# Patient Record
Sex: Female | Born: 1970 | Race: White | Hispanic: No | Marital: Married | Smoking: Never smoker
Health system: Southern US, Community
[De-identification: ages and names within clinical notes are randomized; demographics above are authoritative.]

## PROBLEM LIST (undated history)

## (undated) DIAGNOSIS — S134XXA Sprain of ligaments of cervical spine, initial encounter: Secondary | ICD-10-CM

## (undated) DIAGNOSIS — I959 Hypotension, unspecified: Secondary | ICD-10-CM

## (undated) DIAGNOSIS — G43909 Migraine, unspecified, not intractable, without status migrainosus: Secondary | ICD-10-CM

## (undated) DIAGNOSIS — H9319 Tinnitus, unspecified ear: Secondary | ICD-10-CM

## (undated) DIAGNOSIS — N83209 Unspecified ovarian cyst, unspecified side: Secondary | ICD-10-CM

## (undated) DIAGNOSIS — R200 Anesthesia of skin: Secondary | ICD-10-CM

## (undated) DIAGNOSIS — K219 Gastro-esophageal reflux disease without esophagitis: Secondary | ICD-10-CM

## (undated) DIAGNOSIS — S060XAA Concussion with loss of consciousness status unknown, initial encounter: Secondary | ICD-10-CM

## (undated) DIAGNOSIS — G9050 Complex regional pain syndrome I, unspecified: Secondary | ICD-10-CM

## (undated) DIAGNOSIS — S060X9A Concussion with loss of consciousness of unspecified duration, initial encounter: Secondary | ICD-10-CM

## (undated) DIAGNOSIS — J45909 Unspecified asthma, uncomplicated: Secondary | ICD-10-CM

## (undated) DIAGNOSIS — Q048 Other specified congenital malformations of brain: Secondary | ICD-10-CM

## (undated) DIAGNOSIS — T7840XA Allergy, unspecified, initial encounter: Secondary | ICD-10-CM

## (undated) HISTORY — PX: OTHER SURGICAL HISTORY: SHX169

## (undated) HISTORY — PX: HAND SURGERY: SHX662

## (undated) HISTORY — DX: Allergy, unspecified, initial encounter: T78.40XA

## (undated) HISTORY — DX: Gastro-esophageal reflux disease without esophagitis: K21.9

---

## 1999-11-12 HISTORY — PX: HAND SURGERY: SHX662

## 2001-06-09 ENCOUNTER — Ambulatory Visit (HOSPITAL_BASED_OUTPATIENT_CLINIC_OR_DEPARTMENT_OTHER): Admission: RE | Admit: 2001-06-09 | Discharge: 2001-06-09 | Payer: Self-pay | Admitting: Orthopedic Surgery

## 2002-03-31 ENCOUNTER — Encounter: Admission: RE | Admit: 2002-03-31 | Discharge: 2002-03-31 | Payer: Self-pay | Admitting: Family Medicine

## 2002-03-31 ENCOUNTER — Encounter: Payer: Self-pay | Admitting: Family Medicine

## 2003-07-15 ENCOUNTER — Encounter: Payer: Self-pay | Admitting: Family Medicine

## 2003-07-15 ENCOUNTER — Encounter: Admission: RE | Admit: 2003-07-15 | Discharge: 2003-07-15 | Payer: Self-pay | Admitting: Family Medicine

## 2003-07-27 ENCOUNTER — Encounter: Payer: Self-pay | Admitting: Family Medicine

## 2003-07-27 ENCOUNTER — Encounter: Admission: RE | Admit: 2003-07-27 | Discharge: 2003-07-27 | Payer: Self-pay | Admitting: Family Medicine

## 2003-12-19 ENCOUNTER — Other Ambulatory Visit: Admission: RE | Admit: 2003-12-19 | Discharge: 2003-12-19 | Payer: Self-pay | Admitting: Obstetrics & Gynecology

## 2004-02-06 ENCOUNTER — Encounter: Admission: RE | Admit: 2004-02-06 | Discharge: 2004-02-06 | Payer: Self-pay | Admitting: Obstetrics & Gynecology

## 2004-11-23 ENCOUNTER — Ambulatory Visit: Payer: Self-pay | Admitting: Family Medicine

## 2005-06-05 ENCOUNTER — Ambulatory Visit: Payer: Self-pay | Admitting: Family Medicine

## 2005-06-07 ENCOUNTER — Other Ambulatory Visit: Admission: RE | Admit: 2005-06-07 | Discharge: 2005-06-07 | Payer: Self-pay | Admitting: Obstetrics & Gynecology

## 2005-06-18 ENCOUNTER — Encounter: Admission: RE | Admit: 2005-06-18 | Discharge: 2005-06-18 | Payer: Self-pay | Admitting: Family Medicine

## 2005-06-18 ENCOUNTER — Ambulatory Visit: Payer: Self-pay | Admitting: Family Medicine

## 2005-06-20 ENCOUNTER — Encounter: Admission: RE | Admit: 2005-06-20 | Discharge: 2005-07-10 | Payer: Self-pay | Admitting: Family Medicine

## 2005-09-24 ENCOUNTER — Ambulatory Visit: Payer: Self-pay | Admitting: Family Medicine

## 2006-01-10 ENCOUNTER — Ambulatory Visit: Payer: Self-pay | Admitting: Family Medicine

## 2006-05-06 ENCOUNTER — Ambulatory Visit: Payer: Self-pay | Admitting: Family Medicine

## 2006-05-19 ENCOUNTER — Ambulatory Visit: Payer: Self-pay | Admitting: Family Medicine

## 2006-05-21 ENCOUNTER — Ambulatory Visit: Payer: Self-pay | Admitting: Family Medicine

## 2006-12-09 ENCOUNTER — Ambulatory Visit: Payer: Self-pay | Admitting: Family Medicine

## 2006-12-15 ENCOUNTER — Ambulatory Visit: Payer: Self-pay | Admitting: Family Medicine

## 2007-07-17 ENCOUNTER — Ambulatory Visit: Payer: Self-pay | Admitting: Family Medicine

## 2007-07-17 DIAGNOSIS — H9319 Tinnitus, unspecified ear: Secondary | ICD-10-CM | POA: Insufficient documentation

## 2007-07-17 DIAGNOSIS — J309 Allergic rhinitis, unspecified: Secondary | ICD-10-CM

## 2007-07-17 DIAGNOSIS — J3089 Other allergic rhinitis: Secondary | ICD-10-CM | POA: Insufficient documentation

## 2007-07-17 DIAGNOSIS — N83209 Unspecified ovarian cyst, unspecified side: Secondary | ICD-10-CM

## 2007-07-24 ENCOUNTER — Encounter: Admission: RE | Admit: 2007-07-24 | Discharge: 2007-07-24 | Payer: Self-pay | Admitting: Family Medicine

## 2007-11-13 ENCOUNTER — Encounter: Admission: RE | Admit: 2007-11-13 | Discharge: 2007-11-13 | Payer: Self-pay | Admitting: Obstetrics and Gynecology

## 2008-01-08 ENCOUNTER — Telehealth: Payer: Self-pay | Admitting: Family Medicine

## 2008-03-10 ENCOUNTER — Ambulatory Visit: Payer: Self-pay | Admitting: Family Medicine

## 2008-03-14 ENCOUNTER — Telehealth: Payer: Self-pay | Admitting: Family Medicine

## 2008-03-14 ENCOUNTER — Ambulatory Visit: Payer: Self-pay | Admitting: Family Medicine

## 2009-04-24 ENCOUNTER — Ambulatory Visit: Payer: Self-pay | Admitting: Family Medicine

## 2009-04-24 DIAGNOSIS — K219 Gastro-esophageal reflux disease without esophagitis: Secondary | ICD-10-CM | POA: Insufficient documentation

## 2009-12-13 ENCOUNTER — Ambulatory Visit: Payer: Self-pay | Admitting: Family Medicine

## 2009-12-13 DIAGNOSIS — J1189 Influenza due to unidentified influenza virus with other manifestations: Secondary | ICD-10-CM

## 2009-12-15 ENCOUNTER — Telehealth: Payer: Self-pay | Admitting: Family Medicine

## 2009-12-19 ENCOUNTER — Telehealth: Payer: Self-pay | Admitting: Family Medicine

## 2010-03-09 ENCOUNTER — Ambulatory Visit: Payer: Self-pay | Admitting: Family Medicine

## 2010-12-11 NOTE — Assessment & Plan Note (Signed)
Summary: allergy/cjr   Vital Signs:  Patient profile:   40 year old female Weight:      123 pounds Temp:     98.3 degrees F oral BP sitting:   106 / 78  (left arm) Cuff size:   regular  Vitals Entered By: Raechel Ache, RN (March 09, 2010 11:51 AM) CC: C/o allergies- sneezing and drainage.   History of Present Illness: Here for a flare of her allergies. Has itchy eyes, sneezing, PND, and a dry cough. No fever. On Fexofenidine daily.   Allergies: 1)  ! Augmentin  Past History:  Past Medical History: Reviewed history from 04/24/2009 and no changes required. Allergic rhinitis GERD  Review of Systems  The patient denies anorexia, fever, weight loss, weight gain, vision loss, decreased hearing, hoarseness, chest pain, syncope, dyspnea on exertion, peripheral edema, headaches, hemoptysis, abdominal pain, melena, hematochezia, severe indigestion/heartburn, hematuria, incontinence, genital sores, muscle weakness, suspicious skin lesions, transient blindness, difficulty walking, depression, unusual weight change, abnormal bleeding, enlarged lymph nodes, angioedema, breast masses, and testicular masses.    Physical Exam  General:  Well-developed,well-nourished,in no acute distress; alert,appropriate and cooperative throughout examination Head:  Normocephalic and atraumatic without obvious abnormalities. No apparent alopecia or balding. Eyes:  No corneal or conjunctival inflammation noted. EOMI. Perrla. Funduscopic exam benign, without hemorrhages, exudates or papilledema. Vision grossly normal. Ears:  External ear exam shows no significant lesions or deformities.  Otoscopic examination reveals clear canals, tympanic membranes are intact bilaterally without bulging, retraction, inflammation or discharge. Hearing is grossly normal bilaterally. Nose:  External nasal examination shows no deformity or inflammation. Nasal mucosa are pink and moist without lesions or exudates. Mouth:  Oral  mucosa and oropharynx without lesions or exudates.  Teeth in good repair. Neck:  No deformities, masses, or tenderness noted. Lungs:  Normal respiratory effort, chest expands symmetrically. Lungs are clear to auscultation, no crackles or wheezes.   Impression & Recommendations:  Problem # 1:  ALLERGIC RHINITIS (ICD-477.9)  Her updated medication list for this problem includes:    Fexofenadine Hcl 60 Mg Tabs (Fexofenadine hcl) .Marland Kitchen... 1 d as needed  Orders: Depo- Medrol 80mg  (J1040) Admin of Therapeutic Inj  intramuscular or subcutaneous ([address])  Complete Medication List: 1)  Mucinex Dm Maximum Strength 60-1200 Mg Xr12h-tab (Dextromethorphan-guaifenesin) .... Use as directed 2)  Fexofenadine Hcl 60 Mg Tabs (Fexofenadine hcl) .Marland Kitchen.. 1 d as needed  Patient Instructions: 1)  Given a steroid shot. Continue antihistamines.  2)  Please schedule a follow-up appointment as needed .    Medication Administration  Injection # 1:    Medication: Depo- Medrol 80mg     Diagnosis: ALLERGIC RHINITIS (ICD-477.9)    Route: IM    Site: RUOQ gluteus    Exp Date: 11/13    Lot #: [id number]    Mfr: Pharmacia    Comments: 120 mg given    Patient tolerated injection without complications    Given by: Raechel Ache, RN (March 09, 2010 1:30 PM)  Orders Added: 1)  Est. Patient Level IV [09811] 2)  Depo- Medrol 80mg  [J1040] 3)  Admin of Therapeutic Inj  intramuscular or subcutaneous [91478]

## 2010-12-11 NOTE — Progress Notes (Signed)
Summary: RX  Phone Note Call from Patient   Caller: Patient Call For: Nelwyn Salisbury MD Summary of Call: Pt saw Dr. Scotty Court Wednesday with flu and given Tamiflu. She needs RX for cough, congestion, and sinus pain, please.  Did not have these symptoms when here. [phone] Initial call taken by: Lynann Beaver CMA,  December 15, 2009 9:22 AM  Follow-up for Phone Call        use Mucinex D for the sinus pressure, and call in Hydromet syrup, 1 tsp q 4 hours as needed cough, 240 ml, no rf Follow-up by: Nelwyn Salisbury MD,  December 15, 2009 9:37 AM  Additional Follow-up for Phone Call Additional follow up Details #1::        [facility]) and pt notified.  Cannot put in med list until chart signed off. Additional Follow-up by: Lynann Beaver CMA,  December 15, 2009 11:44 AM

## 2010-12-11 NOTE — Assessment & Plan Note (Signed)
Summary: CHILLS,BODY ACHES & SORENESS/WANTS CHECK & MED TO TREAT FLU/P...   Vital Signs:  Patient profile:   40 year old female Weight:      123 pounds Temp:     98.4 degrees F oral Pulse rate:   90 / minute BP sitting:   96 / 62  Vitals Entered By: Lynann Beaver CMA (December 13, 2009 1:58 PM) CC: chills and body aches x 2 days Is Patient Diabetic? No Pain Assessment Patient in pain? no        History of Present Illness: This 40 year old white femalehas had chills fever headache and over the past 2 days increasing in severity last night, cough slightly productive of mucus some sore throat there no gastrointestinal symptoms, no GU symptoms, joints are aching No other complaints  Allergies: 1)  ! 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    Physical Exam  General:  well-developed well-nourished but appears acutely ill Head:  Normocephalic and atraumatic without obvious abnormalities. No apparent alopecia or balding. Eyes:  No corneal or conjunctival inflammation noted. EOMI. Perrla. Funduscopic exam benign, without hemorrhages, exudates or papilledema. Vision grossly normal. Ears:  External ear exam shows no significant lesions or deformities.  Otoscopic examination reveals clear canals,  tympanic membranes are intact bilaterally without bulging, retraction, inflammation or discharge. Hearing is grossly normal bilaterally. Nose:  minimal congestion Mouth:  minimal erythema of pharynx Neck:  No deformities, masses, or tenderness noted. Lungs:  Normal respiratory effort, chest expands symmetrically. Lungs are clear to auscultation, no crackles or wheezes. Heart:  Normal rate and regular rhythm. S1 and S2 normal without gallop, murmur, click, rub or other extra sounds.   Impression & Recommendations:  Problem # 1:  INFLUENZA (ICD-487.8) Assessment New  tamiflu 75 mg two times a day ibuprofen 800 mg q 4-6 h prn  Orders: Prescription Created Electronically [phone])  Problem # 2:  ALLERGIC RHINITIS (ICD-477.9) Assessment: Improved  The following medications were removed from the medication list:    Allegra 180 Mg Tabs (Fexofenadine hcl) ..... Once daily  Complete Medication List: 1)  Tamiflu 75 Mg Caps (Oseltamivir phosphate) .Marland Kitchen.. 1 two times a day to shorten or prevent flu 2)  Ibuprofen 200 Mg Caps (Ibuprofen) .... 3-4 every 6 hours p.r.n. a container for  Patient Instructions: 1)  You nhave influenza 2)  tamiflu two times a day x 5 days 3)  omnaris nasal spray 2 sprays in each nostril daily 4)  Keep good fluid intake 5)  ibuprofen 800 mg q4-6h as needed aching pain or fever 6)  call if any change Prescriptions: TAMIFLU 75 MG CAPS (OSELTAMIVIR PHOSPHATE) 1 two times a day to shorten or prevent flu  #10 x 1   Entered and Authorized by:   Judithann Sheen MD   Signed by:   Judithann Sheen MD on 12/13/2009   Method used:   [facility]ically to        [id number]  [address]       [place], [place]  [address]       Ph: [phone] or [phone]       Fax: [phone]   RxID:   [id number]

## 2010-12-11 NOTE — Progress Notes (Signed)
Summary: requesting antibiotic  Phone Note Call from Patient   Caller: Patient Call For: Rickard Patience, MD Summary of Call: Pt has finished the Tamiflu, and is still having a lot of nasal congestion with slightly colored mucus.  Is asking for an antibiotic.  No fever or productive cough. [facility]) [phone] Initial call taken by: Lynann Beaver CMA,  December 19, 2009 9:01 AM  Follow-up for Phone Call        per dr Scotty Court  cephalaxin 500mg  three times a day x 10 days ERROR_wrong pt--pharmacy called to disregard this.  per dr Scotty Court z pak and mucinex D max strength OTC.  Patient notified. see Rx Follow-up by: Gladis Riffle, RN,  December 19, 2009 5:17 PM    New/Updated Medications: CEPHALEXIN 500 MG TABS (CEPHALEXIN) Take 1 tablet by mouth three times a day x 10 days ZITHROMAX Z-PAK 250 MG TABS (AZITHROMYCIN) Take two at once and then Take 1 tablet by mouth once a day MUCINEX DM MAXIMUM STRENGTH 60-1200 MG XR12H-TAB (DEXTROMETHORPHAN-GUAIFENESIN) use as directed Prescriptions: ZITHROMAX Z-PAK 250 MG TABS (AZITHROMYCIN) Take two at once and then Take 1 tablet by mouth once a day  #1 pkg x 0   Entered by:   Gladis Riffle, RN   Authorized by:   Judithann Sheen MD   Signed by:   Gladis Riffle, RN on 12/19/2009   Method used:   Electronically to        [id number]       [address]       [place], [place]  [address]       Ph: [phone] or [phone]       Fax: [phone]   RxID:   [id number] CEPHALEXIN 500 MG TABS (CEPHALEXIN) Take 1 tablet by mouth three times a day x 10 days  #30 x 0   Entered by:   Gladis Riffle, RN   Authorized by:   Judithann Sheen MD   Signed by:   Gladis Riffle, RN on 12/19/2009   Method used:   Electronically to        [id number]       [address]       [place], [place]  [address]       Ph: [phone] or [phone]       Fax: [phone]   RxID:   [id number]  disregard cephalexin as this was done in  error--called pharmacy to notify them

## 2011-02-05 ENCOUNTER — Encounter: Payer: Self-pay | Admitting: Family Medicine

## 2011-02-07 ENCOUNTER — Ambulatory Visit (INDEPENDENT_AMBULATORY_CARE_PROVIDER_SITE_OTHER): Payer: BC Managed Care – PPO | Admitting: Family Medicine

## 2011-02-07 ENCOUNTER — Encounter: Payer: Self-pay | Admitting: Family Medicine

## 2011-02-07 VITALS — BP 110/70 | HR 78 | Temp 98.5°F | Ht 63.5 in | Wt 128.0 lb

## 2011-02-07 DIAGNOSIS — Z9109 Other allergy status, other than to drugs and biological substances: Secondary | ICD-10-CM

## 2011-02-07 DIAGNOSIS — J309 Allergic rhinitis, unspecified: Secondary | ICD-10-CM

## 2011-02-07 MED ORDER — METHYLPREDNISOLONE ACETATE 80 MG/ML IJ SUSP
120.0000 mg | Freq: Once | INTRAMUSCULAR | Status: AC
  Administered 2011-02-07: 120 mg via INTRAMUSCULAR

## 2011-02-07 NOTE — Progress Notes (Signed)
  Subjective:    Patient ID: Susan Foster, female    DOB: 10/25/1971, 40 y.o.   MRN: [id number]  HPI Here for typical springtime allergies. She has been struggling with itchy eyes, facial puffiness, PND, swelling in the back of the throat, and a dry cough for about one week. No ST or fever. Using Fexofenidine.    Review of Systems  Constitutional: Negative.   HENT: Positive for facial swelling, rhinorrhea and postnasal drip. Negative for ear pain, congestion and sinus pressure.   Eyes: Negative for discharge and redness.  Respiratory: Negative.   Cardiovascular: Negative.        Objective:   Physical Exam  Constitutional: She appears well-developed and well-nourished.  HENT:  Head: Normocephalic and atraumatic.  Right Ear: External ear normal.  Left Ear: External ear normal.  Nose: Nose normal.  Mouth/Throat: Oropharynx is clear and moist. No oropharyngeal exudate.  Eyes: Conjunctivae are normal. Pupils are equal, round, and reactive to light. Right eye exhibits no discharge. Left eye exhibits no discharge.  Neck: Normal range of motion. Neck supple.  Pulmonary/Chest: Effort normal and breath sounds normal.  Lymphadenopathy:    She has no cervical adenopathy.          Assessment & Plan:  This is a flare of her allergies. Given a steroid shot.

## 2011-03-29 NOTE — Op Note (Signed)
[place]  Patient:    Susan Foster, Susan Foster                        MRN: [id number] Proc. Date: 06/09/01 Adm. Date:  [id number] Attending:  Susa Day                           Operative Report  PREOPERATIVE DIAGNOSIS:  Displaced transverse unstable fracture, right small finger metacarpal diaphysis.  POSTOPERATIVE DIAGNOSIS:  Displaced transverse unstable fracture, right small finger metacarpal diaphysis.  PROCEDURE:  Open reduction and internal fixation of right small finger metacarpal unstable diaphyseal fracture.  SURGEON:  Katy Fitch. Sypher, Montez Hageman., M.D.  ASSISTANT:  Jonni Sanger, P.A.  ANESTHESIA:  General by LMA supervised by the anesthesiologist, Janetta Hora. Gelene Mink, M.D.  INDICATIONS:  Susan Foster is a 40 year old woman who accidentally struck her right hand against a firm object (a tree) on June 07, 2001.  She had acute pain, swelling, and deformity of the hand.  She was seen by her primary care physician, Dr. Clent Ridges, at [facility], and had x-rays obtained, which revealed a displaced diaphyseal fracture of the right small finger metacarpal. This was clearly unstable and angulated apex dorsally.  She was sent for hand surgery consult.  Clinical examination confirmed her fracture predicament and the instability of her fracture.  I recommended proceeding with open reduction and internal fixation with an ASIF four-hole plate, which I placed in a dorsal ulnar position.  After informed consent, she was brought to the operating room at this time.  DESCRIPTION OF PROCEDURE:  Susan Foster is brought to the operating room and placed in the supine position on the operating table.  Following induction of general anesthesia by LMA, the right arm is prepped with Betadine soap and solution and sterilely draped.  Following exsanguination of the limb with Esmarch bandage, the arterial tourniquet was inflated to 220 mmHg.   The procedure commenced with a longitudinal incision over the dorsal ulnar aspect of the small finger metacarpal.  Subcutaneous tissues were carefully divided, splitting the interval between the extensor digitorum longus and the extensor digiti minimi tendon.  The periosteum overlying the fracture site was ruptured, and a hematoma had collected.  The periosteum was incised and elevated with a Therapist, nutritional, exposing the fracture site.  There was mild comminution.  The fracture was grossly unstable due to disruption of the periosteum circumferentially.  This was anatomically reduced, and a four-hole ASIF plate was placed in dorsal ulnar position and secured with three 8 mm screws in the three distal holes and a 10 mm screw in the most proximal hole.  Anatomic reduction was achieved. AP, lateral C-arm images were obtained, documenting satisfactory reduction of the fracture and good position of the hardware.  The wound was irrigated and subsequently repaired with intradermal 3-0 Prolene.  A compressive dressing was applied with the hand in safe position.  No apparent complications. DD:  06/09/01 TD:  06/09/01 Job: [id number]

## 2011-03-29 NOTE — Assessment & Plan Note (Signed)
[facility]                                 ON-CALL NOTE   NAME:INGE, ALAINE LOUGHNEY                      MRN:          [id number]  DATE:12/13/2006                            DOB:          1971/08/04    OUTPATIENT PHONE CONSULTATION: December 13, 2006.  PHONE NUMBER: [phone].   OBJECTIVE:  The patient thinks she may be having an allergic reaction to  medication. She saw Dr. Clent Ridges on Tuesday for an infection under the nail  bed which is on the lateral side of the nail. She was put on Augmentin  which she has been on since that time. This morning she developed a rash  on her chest, abdomen, waist and back which has become progressively  itchy since that time. She had night sweats and chills when she was put  on the antibiotic. Those have resolved. She describes it as on the  lateral side of the nailbed, it has significantly improved where it is  not inflamed.   ASSESSMENT:  Possible allergy to Augmentin.   PLAN:  Stop antibiotics. Do aggressive soaking of the nail in hot water  every 2 hours for the next 2-3 days. Call back if it worsens or go in to  see Dr. Clent Ridges again. Otherwise continue with soaks.   PRIMARY CARE PHYSICIAN:  Dr. Clent Ridges.     Arta Silence, MD  Electronically Signed    RNS/MedQ  DD: 12/13/2006  DT: 12/13/2006  Job #: [id number]

## 2011-08-05 ENCOUNTER — Encounter: Payer: Self-pay | Admitting: Family Medicine

## 2011-08-05 ENCOUNTER — Ambulatory Visit (INDEPENDENT_AMBULATORY_CARE_PROVIDER_SITE_OTHER): Payer: BC Managed Care – PPO | Admitting: Family Medicine

## 2011-08-05 VITALS — BP 110/64 | HR 87 | Temp 98.3°F | Wt 127.0 lb

## 2011-08-05 DIAGNOSIS — J329 Chronic sinusitis, unspecified: Secondary | ICD-10-CM

## 2011-08-05 MED ORDER — AZITHROMYCIN 250 MG PO TABS: ORAL_TABLET | ORAL | Status: AC

## 2011-08-05 NOTE — Progress Notes (Signed)
  Subjective:    Patient ID: Susan Foster, female    DOB: June 15, 1971, 40 y.o.   MRN: [id number]  HPI Here for 5 days of stuffy head, HA, PND, ST, and a dry cough. No fever. On Fexofenadine.    Review of Systems  Constitutional: Negative.   HENT: Positive for congestion, postnasal drip and sinus pressure.   Eyes: Negative.   Respiratory: Positive for cough.        Objective:   Physical Exam  Constitutional: She appears well-developed and well-nourished.  HENT:  Right Ear: External ear normal.  Left Ear: External ear normal.  Nose: Nose normal.  Mouth/Throat: Oropharynx is clear and moist. No oropharyngeal exudate.  Eyes: Conjunctivae are normal. Pupils are equal, round, and reactive to light.  Neck: Neck supple. No thyromegaly present.  Pulmonary/Chest: Effort normal and breath sounds normal.  Lymphadenopathy:    She has no cervical adenopathy.          Assessment & Plan:  Add Mucinex D and a Zpack

## 2011-10-08 ENCOUNTER — Other Ambulatory Visit: Payer: Self-pay | Admitting: Family Medicine

## 2011-10-08 MED ORDER — FEXOFENADINE HCL 60 MG PO TABS: 60.0000 mg | ORAL_TABLET | Freq: Every day | ORAL | Status: DC

## 2011-10-08 NOTE — Telephone Encounter (Signed)
done

## 2011-10-08 NOTE — Telephone Encounter (Signed)
Pt req refill of fexofenadine (ALLEGRA) [facility] MG [facility] [address]

## 2012-02-19 ENCOUNTER — Ambulatory Visit (INDEPENDENT_AMBULATORY_CARE_PROVIDER_SITE_OTHER): Payer: BC Managed Care – PPO | Admitting: Family Medicine

## 2012-02-19 ENCOUNTER — Encounter: Payer: Self-pay | Admitting: Family Medicine

## 2012-02-19 VITALS — BP 112/64 | HR 78 | Temp 98.5°F | Wt 127.0 lb

## 2012-02-19 DIAGNOSIS — J309 Allergic rhinitis, unspecified: Secondary | ICD-10-CM

## 2012-02-19 DIAGNOSIS — Z9109 Other allergy status, other than to drugs and biological substances: Secondary | ICD-10-CM

## 2012-02-19 MED ORDER — METHYLPREDNISOLONE ACETATE 80 MG/ML IJ SUSP
120.0000 mg | Freq: Once | INTRAMUSCULAR | Status: AC
  Administered 2012-02-19: 120 mg via INTRAMUSCULAR

## 2012-02-19 NOTE — Progress Notes (Signed)
  Subjective:    Patient ID: Susan Foster, female    DOB: 08-19-1971, 41 y.o.   MRN: [id number]  HPI Here for her typical springtime flare of allergies. She gets itchy eyes, runny nose, congestion, and sneezing. No cough or wheezing. On Allegra.    Review of Systems  Constitutional: Negative.   HENT: Positive for congestion, rhinorrhea, sneezing and postnasal drip.   Eyes: Positive for redness and itching. Negative for discharge.  Respiratory: Negative.        Objective:   Physical Exam  Constitutional: She appears well-developed and well-nourished.  HENT:  Right Ear: External ear normal.  Left Ear: External ear normal.  Nose: Nose normal.  Mouth/Throat: Oropharynx is clear and moist. No oropharyngeal exudate.  Eyes: Conjunctivae are normal.  Pulmonary/Chest: Effort normal and breath sounds normal.  Lymphadenopathy:    She has no cervical adenopathy.          Assessment & Plan:  Recheck prn

## 2012-02-19 NOTE — Progress Notes (Signed)
Addended by: Aniceto Boss A on: 02/19/2012 10:28 AM   Modules accepted: Orders

## 2012-03-25 ENCOUNTER — Encounter: Payer: Self-pay | Admitting: Family Medicine

## 2012-03-25 ENCOUNTER — Ambulatory Visit (INDEPENDENT_AMBULATORY_CARE_PROVIDER_SITE_OTHER): Payer: BC Managed Care – PPO | Admitting: Family Medicine

## 2012-03-25 VITALS — BP 110/62 | HR 77 | Temp 98.6°F | Wt 124.0 lb

## 2012-03-25 DIAGNOSIS — J309 Allergic rhinitis, unspecified: Secondary | ICD-10-CM

## 2012-03-25 DIAGNOSIS — Z9109 Other allergy status, other than to drugs and biological substances: Secondary | ICD-10-CM

## 2012-03-25 MED ORDER — METHYLPREDNISOLONE ACETATE 80 MG/ML IJ SUSP
120.0000 mg | Freq: Once | INTRAMUSCULAR | Status: AC
  Administered 2012-03-25: 120 mg via INTRAMUSCULAR

## 2012-03-25 NOTE — Progress Notes (Signed)
Addended by: Aniceto Boss A on: 03/25/2012 10:59 AM   Modules accepted: Orders

## 2012-03-25 NOTE — Progress Notes (Signed)
  Subjective:    Patient ID: Susan Foster, female    DOB: [birth year], 41 y.o.   MRN: [id number]  HPI Here for another flare of her allergies. She had a Depomedrol shot here about 5 weeks ago, and this worked quite well. However 4 days ago she again started having sneezing, itchy eyes, runny nose, and PND. No fever or cough. On Allegra.    Review of Systems  Constitutional: Negative.   HENT: Positive for rhinorrhea, sneezing and postnasal drip.   Eyes: Positive for itching. Negative for pain and redness.  Respiratory: Negative.        Objective:   Physical Exam  Constitutional: She appears well-developed and well-nourished.  HENT:  Right Ear: External ear normal.  Left Ear: External ear normal.  Nose: Nose normal.  Mouth/Throat: Oropharynx is clear and moist.  Eyes: Conjunctivae are normal.  Neck: No thyromegaly present.  Pulmonary/Chest: Effort normal and breath sounds normal.  Lymphadenopathy:    She has no cervical adenopathy.          Assessment & Plan:  Given another steroid shot. That should get her through the rest of the allergy season

## 2012-06-02 ENCOUNTER — Encounter: Payer: Self-pay | Admitting: Family Medicine

## 2012-06-02 ENCOUNTER — Ambulatory Visit (INDEPENDENT_AMBULATORY_CARE_PROVIDER_SITE_OTHER): Payer: BC Managed Care – PPO | Admitting: Family Medicine

## 2012-06-02 VITALS — BP 108/64 | HR 83 | Temp 98.7°F | Wt 121.0 lb

## 2012-06-02 DIAGNOSIS — J309 Allergic rhinitis, unspecified: Secondary | ICD-10-CM

## 2012-06-02 DIAGNOSIS — Z9109 Other allergy status, other than to drugs and biological substances: Secondary | ICD-10-CM

## 2012-06-02 NOTE — Progress Notes (Signed)
  Subjective:    Patient ID: Susan Foster, female    DOB: 12-28-1970, 41 y.o.   MRN: [id number]  HPI Here for one week of itchy eyes, stuffy head, sneezing, and PND. No fever or cough.    Review of Systems  Constitutional: Negative.   HENT: Positive for congestion, sneezing and postnasal drip.   Eyes: Negative.   Respiratory: Negative.        Objective:   Physical Exam  Constitutional: She appears well-developed and well-nourished.  HENT:  Right Ear: External ear normal.  Left Ear: External ear normal.  Nose: Nose normal.  Mouth/Throat: Oropharynx is clear and moist.  Eyes: Conjunctivae are normal.  Neck: No thyromegaly present.  Cardiovascular: Normal rate, regular rhythm, normal heart sounds and intact distal pulses.   Pulmonary/Chest: Effort normal and breath sounds normal.  Lymphadenopathy:    She has no cervical adenopathy.          Assessment & Plan:  These are allergies, so get on a good antihistamine and decongestant combination

## 2012-10-05 ENCOUNTER — Encounter: Payer: Self-pay | Admitting: Family Medicine

## 2012-10-05 ENCOUNTER — Ambulatory Visit (INDEPENDENT_AMBULATORY_CARE_PROVIDER_SITE_OTHER): Payer: BC Managed Care – PPO | Admitting: Family Medicine

## 2012-10-05 VITALS — BP 106/70 | HR 82 | Temp 98.3°F | Wt 126.0 lb

## 2012-10-05 DIAGNOSIS — M7520 Bicipital tendinitis, unspecified shoulder: Secondary | ICD-10-CM

## 2012-10-05 MED ORDER — HYDROCODONE-HOMATROPINE 5-1.5 MG/5ML PO SYRP: 5.0000 mL | ORAL_SOLUTION | ORAL | Status: AC | PRN

## 2012-10-05 NOTE — Progress Notes (Signed)
  Subjective:    Patient ID: Susan Foster, female    DOB: 1971/06/27, 41 y.o.   MRN: [id number]  HPI Here for 3 months of intermittent mild pain in the anterior right shoulder, and she has noticed that when she lifts weight with a shoulder shrug motion that the top of the right shoulder elevated higher than the left one. She works out at [employer] 3 days a week. No hx of trauma.    Review of Systems  Constitutional: Negative.   Musculoskeletal: Positive for arthralgias.       Objective:   Physical Exam  Constitutional: She appears well-developed and well-nourished. No distress.  Musculoskeletal:       She is tender in the anterior right shoulder over the insertion of the biceps tendon. Flexion of this muscle against resistance is painful. No swelling. ROM is full. She does indeed lift the top of the right shoulder higher than the left but I think this is from compensating to avoid isolating the biceps          Assessment & Plan:  Biceps tendonitis. Rest , ice , Motrin prn. Advised her to avoid lifting weights for at least 4 weeks to let this heal.

## 2013-01-25 ENCOUNTER — Ambulatory Visit: Payer: BC Managed Care – PPO | Admitting: Family Medicine

## 2013-01-27 ENCOUNTER — Ambulatory Visit (INDEPENDENT_AMBULATORY_CARE_PROVIDER_SITE_OTHER): Payer: BC Managed Care – PPO | Admitting: Family Medicine

## 2013-01-27 ENCOUNTER — Encounter: Payer: Self-pay | Admitting: Family Medicine

## 2013-01-27 VITALS — BP 106/64 | HR 80 | Temp 98.1°F | Wt 122.0 lb

## 2013-01-27 DIAGNOSIS — F411 Generalized anxiety disorder: Secondary | ICD-10-CM

## 2013-01-27 DIAGNOSIS — Z9109 Other allergy status, other than to drugs and biological substances: Secondary | ICD-10-CM

## 2013-01-27 DIAGNOSIS — F418 Other specified anxiety disorders: Secondary | ICD-10-CM

## 2013-01-27 NOTE — Progress Notes (Signed)
  Subjective:    Patient ID: Susan Foster, female    DOB: Oct 28, 1971, 42 y.o.   MRN: [id number]  HPI Here to discuss her allergies and to ask if she may have ADHD. Over the past week she has developed sneezing, stuffy nose and sinus congestion. No cough or fever. Also she mentions occasional difficulty with her focus at work and has trouble getting tasks done. She has not gotten any bad job reviews so far. She had been under a lot of stress this past winter with relationship issues, but she broke up with her boyfriend and moved back out on her own, and now she is a lot happier. Her focus seems to be getting steadily better.    Review of Systems  Constitutional: Negative.   HENT: Positive for congestion, rhinorrhea, sneezing and postnasal drip.   Eyes: Negative.   Respiratory: Negative.   Psychiatric/Behavioral: Negative.        Objective:   Physical Exam  Constitutional: She is oriented to person, place, and time. She appears well-developed and well-nourished.  HENT:  Right Ear: External ear normal.  Left Ear: External ear normal.  Nose: Nose normal.  Mouth/Throat: Oropharynx is clear and moist.  Eyes: Conjunctivae are normal.  Lymphadenopathy:    She has no cervical adenopathy.  Neurological: She is alert and oriented to person, place, and time.  Psychiatric: She has a normal mood and affect. Her behavior is normal. Thought content normal.          Assessment & Plan:  She may well have some mild ADHD but she seems to be doing better now that her stress levels are down. She will follow up prn. Get back on Claritin D daily.

## 2013-03-08 ENCOUNTER — Ambulatory Visit (INDEPENDENT_AMBULATORY_CARE_PROVIDER_SITE_OTHER): Payer: BC Managed Care – PPO | Admitting: Family Medicine

## 2013-03-08 ENCOUNTER — Encounter: Payer: Self-pay | Admitting: Family Medicine

## 2013-03-08 VITALS — BP 108/66 | HR 76 | Temp 97.9°F | Wt 120.0 lb

## 2013-03-08 DIAGNOSIS — J309 Allergic rhinitis, unspecified: Secondary | ICD-10-CM

## 2013-03-08 DIAGNOSIS — J45909 Unspecified asthma, uncomplicated: Secondary | ICD-10-CM | POA: Insufficient documentation

## 2013-03-08 MED ORDER — BUDESONIDE-FORMOTEROL FUMARATE 160-4.5 MCG/ACT IN AERO: 2.0000 | INHALATION_SPRAY | Freq: Two times a day (BID) | RESPIRATORY_TRACT | Status: DC

## 2013-03-08 MED ORDER — ALBUTEROL SULFATE HFA 108 (90 BASE) MCG/ACT IN AERS: 2.0000 | INHALATION_SPRAY | RESPIRATORY_TRACT | Status: DC | PRN

## 2013-03-08 NOTE — Progress Notes (Signed)
  Subjective:    Patient ID: Susan Foster, female    DOB: 19-Jan-1971, 42 y.o.   MRN: [id number]  HPI Here for allergy issues causing occasional SOB. No coughing or wheezing. This usually only bothers her during the spring months when tree pollen is worst. It bothers her when exercising at the gym or when she is walking or running outside. Using Claritin D which helps her nasal and sinus symptoms.    Review of Systems  Constitutional: Negative.   HENT: Negative.   Eyes: Negative.   Respiratory: Positive for chest tightness and shortness of breath. Negative for cough.   Cardiovascular: Negative.        Objective:   Physical Exam  Constitutional: She appears well-developed and well-nourished.  HENT:  Right Ear: External ear normal.  Nose: Nose normal.  Mouth/Throat: Oropharynx is clear and moist.  Eyes: Conjunctivae are normal.  Neck: Neck supple.  Cardiovascular: Normal rate, regular rhythm, normal heart sounds and intact distal pulses.   Pulmonary/Chest: Effort normal and breath sounds normal. No respiratory distress. She has no wheezes. She has no rales.  Lymphadenopathy:    She has no cervical adenopathy.          Assessment & Plan:  She has mild asthma. Get on Symbicort bid and then Ventolin HFA for rescue. Recheck prn

## 2013-04-14 ENCOUNTER — Ambulatory Visit (INDEPENDENT_AMBULATORY_CARE_PROVIDER_SITE_OTHER): Payer: BC Managed Care – PPO | Admitting: Family Medicine

## 2013-04-14 ENCOUNTER — Encounter: Payer: Self-pay | Admitting: Family Medicine

## 2013-04-14 VITALS — BP 110/66 | HR 98 | Temp 98.3°F | Wt 118.0 lb

## 2013-04-14 DIAGNOSIS — K1370 Unspecified lesions of oral mucosa: Secondary | ICD-10-CM

## 2013-04-14 NOTE — Progress Notes (Signed)
  Subjective:    Patient ID: Susan Foster, female    DOB: 1971/02/03, 42 y.o.   MRN: [id number]  HPI Here to ask about a lump of tissue she felt under her tongue a few days ago. No pain. She does wear a lower retainer.    Review of Systems  Constitutional: Negative.   HENT: Negative.        Objective:   Physical Exam  Constitutional: She appears well-developed and well-nourished.  HENT:  There is a slightly redundant piece of mucus membrane on the floor of her mouth under the tongue. It is not tender.   Lymphadenopathy:    She has no cervical adenopathy.          Assessment & Plan:  Reassured this is benign. Possible her retainer may be irritating the tissue. She will check with her orthodontist.

## 2013-04-29 ENCOUNTER — Telehealth: Payer: Self-pay | Admitting: Radiology

## 2013-04-29 NOTE — Telephone Encounter (Signed)
error 

## 2013-11-11 HISTORY — PX: OTHER SURGICAL HISTORY: SHX169

## 2013-12-09 ENCOUNTER — Encounter: Payer: Self-pay | Admitting: Family Medicine

## 2013-12-09 ENCOUNTER — Ambulatory Visit (INDEPENDENT_AMBULATORY_CARE_PROVIDER_SITE_OTHER): Payer: BC Managed Care – PPO | Admitting: Family Medicine

## 2013-12-09 VITALS — BP 108/60 | HR 103 | Temp 99.2°F | Ht 63.5 in | Wt 119.0 lb

## 2013-12-09 DIAGNOSIS — J209 Acute bronchitis, unspecified: Secondary | ICD-10-CM

## 2013-12-09 MED ORDER — AZITHROMYCIN 250 MG PO TABS: ORAL_TABLET | ORAL | Status: DC

## 2013-12-09 NOTE — Progress Notes (Signed)
   Subjective:    Patient ID: Susan Foster, female    DOB: [birth year], 43 y.o.   MRN: [id number]  HPI Here for 2 days of fever to 100.3 degrees, aches, HA, and a dry cough.    Review of Systems  Constitutional: Positive for fever.  HENT: Positive for congestion.   Eyes: Negative.   Respiratory: Positive for cough and chest tightness.        Objective:   Physical Exam  Constitutional: She appears well-developed and well-nourished.  HENT:  Right Ear: External ear normal.  Left Ear: External ear normal.  Nose: Nose normal.  Mouth/Throat: Oropharynx is clear and moist.  Eyes: Conjunctivae are normal.  Pulmonary/Chest: Effort normal and breath sounds normal. No respiratory distress. She has no wheezes. She has no rales.  Lymphadenopathy:    She has no cervical adenopathy.          Assessment & Plan:  Drink fluids and add Mucinex

## 2013-12-09 NOTE — Progress Notes (Signed)
Pre visit review using our clinic review tool, if applicable. No additional management support is needed unless otherwise documented below in the visit note. 

## 2013-12-13 ENCOUNTER — Telehealth: Payer: Self-pay | Admitting: Family Medicine

## 2013-12-13 MED ORDER — LEVOFLOXACIN 500 MG PO TABS: 500.0000 mg | ORAL_TABLET | Freq: Every day | ORAL | Status: DC

## 2013-12-13 NOTE — Telephone Encounter (Signed)
Call in Levaquin 500 mg daily for 10 days  

## 2013-12-13 NOTE — Telephone Encounter (Signed)
I sent script e-scribe and left a voice message for pt. 

## 2013-12-13 NOTE — Telephone Encounter (Signed)
Pt states she saw dr fry 1/29 and he gave her azithromycin. Pt states she is still not feeling well and she thinks it is because of bronchitis and wants to know if dr. Sarajane Jews can call her in something else or should she come back in to see him.

## 2013-12-28 ENCOUNTER — Emergency Department (HOSPITAL_COMMUNITY): Payer: BC Managed Care – PPO

## 2013-12-28 ENCOUNTER — Encounter (HOSPITAL_COMMUNITY): Payer: Self-pay | Admitting: Emergency Medicine

## 2013-12-28 ENCOUNTER — Emergency Department (HOSPITAL_COMMUNITY)
Admission: EM | Admit: 2013-12-28 | Discharge: 2013-12-28 | Disposition: A | Discharge status: Home / Self Care | Payer: BC Managed Care – PPO | Attending: Emergency Medicine | Admitting: Emergency Medicine

## 2013-12-28 DIAGNOSIS — Z8719 Personal history of other diseases of the digestive system: Secondary | ICD-10-CM | POA: Insufficient documentation

## 2013-12-28 DIAGNOSIS — Z3202 Encounter for pregnancy test, result negative: Secondary | ICD-10-CM | POA: Insufficient documentation

## 2013-12-28 DIAGNOSIS — R110 Nausea: Secondary | ICD-10-CM | POA: Insufficient documentation

## 2013-12-28 DIAGNOSIS — Z79899 Other long term (current) drug therapy: Secondary | ICD-10-CM | POA: Insufficient documentation

## 2013-12-28 DIAGNOSIS — R197 Diarrhea, unspecified: Secondary | ICD-10-CM | POA: Insufficient documentation

## 2013-12-28 DIAGNOSIS — R1013 Epigastric pain: Secondary | ICD-10-CM | POA: Insufficient documentation

## 2013-12-28 LAB — COMPREHENSIVE METABOLIC PANEL
ALT: 12 U/L (ref 0–35)
AST: 20 U/L (ref 0–37)
Albumin: 2.9 g/dL — ABNORMAL LOW (ref 3.5–5.2)
Alkaline Phosphatase: 57 U/L (ref 39–117)
BUN: 15 mg/dL (ref 6–23)
CALCIUM: 9.3 mg/dL (ref 8.4–10.5)
CHLORIDE: 100 meq/L (ref 96–112)
CO2: 27 mEq/L (ref 19–32)
CREATININE: 0.78 mg/dL (ref 0.50–1.10)
GFR calc Af Amer: >90 mL/min (ref >90)
GFR calc non Af Amer: >90 mL/min (ref >90)
Glucose, Bld: 87 mg/dL (ref 70–99)
Potassium: 4 mEq/L (ref 3.7–5.3)
Sodium: 137 mEq/L (ref 137–147)
TOTAL PROTEIN: 6.7 g/dL (ref 6.0–8.3)
Total Bilirubin: <0.2 mg/dL — ABNORMAL LOW (ref 0.3–1.2)

## 2013-12-28 LAB — CBC WITH DIFFERENTIAL/PLATELET
BASOS PCT: 1 % (ref 0–1)
Basophils Absolute: 0 10*3/uL (ref 0.0–0.1)
EOS ABS: 0.1 10*3/uL (ref 0.0–0.7)
EOS PCT: 2 % (ref 0–5)
HEMATOCRIT: 38 % (ref 36.0–46.0)
HEMOGLOBIN: 13.1 g/dL (ref 12.0–15.0)
LYMPHS PCT: 33 % (ref 12–46)
Lymphs Abs: 1.5 10*3/uL (ref 0.7–4.0)
MCH: 30.8 pg (ref 26.0–34.0)
MCHC: 34.5 g/dL (ref 30.0–36.0)
MCV: 89.2 fL (ref 78.0–100.0)
MONO ABS: 0.6 10*3/uL (ref 0.1–1.0)
MONOS PCT: 14 % — AB (ref 3–12)
NEUTROS PCT: 51 % (ref 43–77)
Neutro Abs: 2.3 10*3/uL (ref 1.7–7.7)
Platelets: 214 10*3/uL (ref 150–400)
RBC: 4.26 MIL/uL (ref 3.87–5.11)
RDW: 12.1 % (ref 11.5–15.5)
WBC: 4.6 10*3/uL (ref 4.0–10.5)

## 2013-12-28 LAB — LIPASE, BLOOD: LIPASE: 27 U/L (ref 11–59)

## 2013-12-28 LAB — URINALYSIS, ROUTINE W REFLEX MICROSCOPIC
BILIRUBIN URINE: NEGATIVE
GLUCOSE, UA: NEGATIVE mg/dL
Hgb urine dipstick: NEGATIVE
KETONES UR: NEGATIVE mg/dL
LEUKOCYTES UA: NEGATIVE
Nitrite: NEGATIVE
Protein, ur: NEGATIVE mg/dL
SPECIFIC GRAVITY, URINE: 1.007 (ref 1.005–1.030)
Urobilinogen, UA: 0.2 mg/dL (ref 0.0–1.0)
pH: 6.5 (ref 5.0–8.0)

## 2013-12-28 LAB — POCT PREGNANCY, URINE: PREG TEST UR: NEGATIVE

## 2013-12-28 MED ORDER — SUCRALFATE 1 G PO TABS: 1.0000 g | ORAL_TABLET | Freq: Three times a day (TID) | ORAL | Status: DC

## 2013-12-28 MED ORDER — OMEPRAZOLE 20 MG PO CPDR: 20.0000 mg | DELAYED_RELEASE_CAPSULE | Freq: Every day | ORAL | Status: DC

## 2013-12-28 MED ORDER — GI COCKTAIL ~~LOC~~
30.0000 mL | Freq: Once | ORAL | Status: AC
  Administered 2013-12-28: 30 mL via ORAL
  Filled 2013-12-28: qty 30

## 2013-12-28 NOTE — ED Provider Notes (Signed)
CSN: [id number]     Arrival date & time 12/28/13  1540 History   First MD Initiated Contact with Patient 12/28/13 1650     Chief Complaint  Patient presents with  . Abdominal Pain     (Consider location/radiation/quality/duration/timing/severity/associated sxs/prior Treatment) HPI Susan Foster is a 43 y.o. female who presents emergency department complaining of epigastric abdominal pain and nausea for 2 days. States that her symptoms began she also developed watery diarrhea that she had for 24 hours. She states she never had any vomiting but persistent nausea. Pain itself states it's in the epigastric area, it comes and goes lasting only a few seconds to minutes at a time. He does not radiate. It is sharp. She denies any history of the same. She denies any recent use of NSAID medications, and denies heavy drinking, denies heavy caffeine. She does not take any medications regularly. She denies any prior abdominal surgeries. She denies any chest pain or shortness of breath. She denies pain with eating. She tried taking pepto bismol for her symptoms, which she thinks helped diarrhea but abdominal pain continues  Past Medical History  Diagnosis Date  . Allergy   . GERD (gastroesophageal reflux disease)    Past Surgical History  Procedure Laterality Date  . Fibroid turmor      removed left breast   History reviewed. No pertinent family history. History  Substance Use Topics  . Smoking status: Never Smoker   . Smokeless tobacco: Never Used  . Alcohol Use: 1.0 oz/week    2 drink(s) per week   OB History   Grav Para Term Preterm Abortions TAB SAB Ect Mult Living                 Review of Systems  Constitutional: Negative for fever and chills.  Respiratory: Negative for cough, chest tightness and shortness of breath.   Cardiovascular: Negative for chest pain, palpitations and leg swelling.  Gastrointestinal: Positive for nausea, abdominal pain and diarrhea. Negative for vomiting and  blood in stool.  Genitourinary: Negative for dysuria, flank pain, vaginal bleeding, vaginal discharge, vaginal pain and pelvic pain.  Musculoskeletal: Negative for arthralgias, myalgias, neck pain and neck stiffness.  Skin: Negative for rash.  Neurological: Negative for dizziness, weakness and headaches.  All other systems reviewed and are negative.      Allergies  Augmentin and Metronidazole  Home Medications   Current Outpatient Rx  Name  Route  Sig  Dispense  Refill  . albuterol (PROVENTIL HFA;VENTOLIN HFA) 108 (90 BASE) MCG/ACT inhaler   Inhalation   Inhale 2 puffs into the lungs every 4 (four) hours as needed for wheezing or shortness of breath.   1 Inhaler   11   . ibuprofen (ADVIL,MOTRIN) 200 MG tablet   Oral   Take 600 mg by mouth every 6 (six) hours as needed (pain).          BP 107/57  Pulse 70  Temp(Src) 98.1 F (36.7 C) (Oral)  Resp 16  SpO2 99%  LMP 12/21/2013 Physical Exam  Nursing note and vitals reviewed. Constitutional: She appears well-developed and well-nourished. No distress.  HENT:  Head: Normocephalic.  Eyes: Conjunctivae are normal.  Neck: Neck supple.  Cardiovascular: Normal rate, regular rhythm and normal heart sounds.   Pulmonary/Chest: Effort normal and breath sounds normal. No respiratory distress. She has no wheezes. She has no rales.  Abdominal: Soft. Bowel sounds are normal. She exhibits no distension and no mass. There is tenderness. There is no  rebound and no guarding.  Epigastric tenderness  Musculoskeletal: She exhibits no edema.  Neurological: She is alert.  Skin: Skin is warm and dry.  Psychiatric: She has a normal mood and affect. Her behavior is normal.    ED Course  Procedures (including critical care time) Labs Review Labs Reviewed  CBC WITH DIFFERENTIAL - Abnormal; Notable for the following:    Monocytes Relative 14 (*)    All other components within normal limits  COMPREHENSIVE METABOLIC PANEL - Abnormal; Notable  for the following:    Albumin 2.9 (*)    Total Bilirubin <0.2 (*)    All other components within normal limits  LIPASE, BLOOD  URINALYSIS, ROUTINE W REFLEX MICROSCOPIC  POCT PREGNANCY, URINE   Imaging Review US Abdomen Complete  12/28/2013   CLINICAL DATA:  Nausea and diarrhea.  EXAM: ULTRASOUND ABDOMEN COMPLETE  COMPARISON:  None.  FINDINGS: Gallbladder:  No gallstones or wall thickening visualized. No sonographic Murphy sign noted.  Common bile duct:  Diameter: 2.8 mm  Liver:  Punctate echo density with posterior shadowing noted most likely calcified granuloma. Liver otherwise unremarkable.  IVC:  No abnormality visualized.  Pancreas:  Visualized portion unremarkable.  Spleen:  Size and appearance within normal limits.  Right Kidney:  Length: 11.4 cm. Echogenicity within normal limits. No mass or hydronephrosis visualized.  Left Kidney:  Length: 11.5 cm. Echogenicity within normal limits. No mass or hydronephrosis visualized.  Abdominal aorta:  No aneurysm visualized.  Other findings:  None.  IMPRESSION: No significant abnormality.   Electronically Signed   By: Marcello Moores  Register   On: 12/28/2013 18:11    EKG Interpretation    Date/Time:  Tuesday December 28 2013 16:39:24 EST Ventricular Rate:  70 PR Interval:  118 QRS Duration: 75 QT Interval:  414 QTC Calculation: 447 R Axis:   76 Text Interpretation:  Sinus rhythm  left atrial enlargement Non-specific ST-t changes No old tracing to compare Confirmed by [place]  MD, [place] ([id number]) on 12/28/2013 4:48:40 PM            MDM   Final diagnoses:  Epigastric abdominal pain   Patient is a healthy 43 year old female here with epigastric abdominal pain. Her abdomen is soft on the exam, normal bowel sounds, mild epigastric tenderness with palpation with no guarding. Her lab work is all normal. Urinalysis is normal. There is no elevation of white count. Ultrasound of the abdomen obtained and is normal as well. Patient did have some relief in  her symptoms with GI cocktail. She has no risk factors for peptic ulcer disease, however she does have history of acid reflux, she's not taking any medications for this. At this time differential includes PUD, gastritis, gallladder disfunction, gerd, gastroenteritis. Will start on PPI, and carafate. Follow up with pcp. Advised to return if worsening.   Filed Vitals:   12/28/13 1628 12/28/13 1845  BP: 107/57 99/58  Pulse: 70 70  Temp: 98.1 F (36.7 C)   TempSrc: Oral   Resp: 16   SpO2: 99% 99%       Renold Genta, PA-C 12/28/13 1916

## 2013-12-28 NOTE — ED Provider Notes (Signed)
Medical screening examination/treatment/procedure(s) were performed by non-physician practitioner and as supervising physician I was immediately available for consultation/collaboration.  EKG Interpretation    Date/Time:  Tuesday December 28 2013 16:39:24 EST Ventricular Rate:  70 PR Interval:  118 QRS Duration: 75 QT Interval:  414 QTC Calculation: 447 R Axis:   76 Text Interpretation:  Sinus rhythm  left atrial enlargement Non-specific ST-t changes No old tracing to compare Confirmed by Wilson Singer  MD, [place] ([id number]) on 12/28/2013 4:48:40 PM              Malvin Johns, MD 12/28/13 [place]

## 2013-12-28 NOTE — Discharge Instructions (Signed)
Your lab work and ultrasound are normal today. Start taking medications as prescribed for possible gastritis. Please follow up with your primary care doctor for recheck. Even though your tests are all normal here, if your symptoms continue, you will need more testing. Return to emergency department if your symptoms are worsening.   Gastritis, Adult Gastritis is soreness and swelling (inflammation) of the lining of the stomach. Gastritis can develop as a sudden onset (acute) or long-term (chronic) condition. If gastritis is not treated, it can lead to stomach bleeding and ulcers. CAUSES  Gastritis occurs when the stomach lining is weak or damaged. Digestive juices from the stomach then inflame the weakened stomach lining. The stomach lining may be weak or damaged due to viral or bacterial infections. One common bacterial infection is the Helicobacter pylori infection. Gastritis can also result from excessive alcohol consumption, taking certain medicines, or having too much acid in the stomach.  SYMPTOMS  In some cases, there are no symptoms. When symptoms are present, they may include:  Pain or a burning sensation in the upper abdomen.  Nausea.  Vomiting.  An uncomfortable feeling of fullness after eating. DIAGNOSIS  Your caregiver may suspect you have gastritis based on your symptoms and a physical exam. To determine the cause of your gastritis, your caregiver may perform the following:  Blood or stool tests to check for the H pylori bacterium.  Gastroscopy. A thin, flexible tube (endoscope) is passed down the esophagus and into the stomach. The endoscope has a light and camera on the end. Your caregiver uses the endoscope to view the inside of the stomach.  Taking a tissue sample (biopsy) from the stomach to examine under a microscope. TREATMENT  Depending on the cause of your gastritis, medicines may be prescribed. If you have a bacterial infection, such as an H pylori infection,  antibiotics may be given. If your gastritis is caused by too much acid in the stomach, H2 blockers or antacids may be given. Your caregiver may recommend that you stop taking aspirin, ibuprofen, or other nonsteroidal anti-inflammatory drugs (NSAIDs). HOME CARE INSTRUCTIONS  Only take over-the-counter or prescription medicines as directed by your caregiver.  If you were given antibiotic medicines, take them as directed. Finish them even if you start to feel better.  Drink enough fluids to keep your urine clear or pale yellow.  Avoid foods and drinks that make your symptoms worse, such as:  Caffeine or alcoholic drinks.  Chocolate.  Peppermint or mint flavorings.  Garlic and onions.  Spicy foods.  Citrus fruits, such as oranges, lemons, or limes.  Tomato-based foods such as sauce, chili, salsa, and pizza.  Fried and fatty foods.  Eat small, frequent meals instead of large meals. SEEK IMMEDIATE MEDICAL CARE IF:   You have black or dark red stools.  You vomit blood or material that looks like coffee grounds.  You are unable to keep fluids down.  Your abdominal pain gets worse.  You have a fever.  You do not feel better after 1 week.  You have any other questions or concerns. MAKE SURE YOU:  Understand these instructions.  Will watch your condition.  Will get help right away if you are not doing well or get worse. Document Released: 10/22/2001 Document Revised: 04/28/2012 Document Reviewed: 12/11/2011 [facility] Patient Information 2014 [place].

## 2013-12-28 NOTE — ED Notes (Signed)
Pt c/o intermittent upper abdominal pain and intermittent nausea x 3 days.  Pain score 6/10.  Pt reports diarrhea x 2 days ago, which has resolved.

## 2014-02-28 ENCOUNTER — Ambulatory Visit (INDEPENDENT_AMBULATORY_CARE_PROVIDER_SITE_OTHER): Payer: BC Managed Care – PPO | Admitting: Family Medicine

## 2014-02-28 ENCOUNTER — Encounter: Payer: Self-pay | Admitting: Family Medicine

## 2014-02-28 VITALS — BP 114/60 | HR 84 | Temp 98.7°F | Ht 63.5 in | Wt 121.0 lb

## 2014-02-28 DIAGNOSIS — J45909 Unspecified asthma, uncomplicated: Secondary | ICD-10-CM

## 2014-02-28 DIAGNOSIS — J209 Acute bronchitis, unspecified: Secondary | ICD-10-CM

## 2014-02-28 IMAGING — US US ABDOMEN COMPLETE
1 series · 14 of 25 positions shown · non-contrast
Comparison: None.

CLINICAL DATA: Nausea and diarrhea.

EXAM:
ULTRASOUND ABDOMEN COMPLETE

[Series 1: us abdomen complete · 0.21mm/px · 14 of 83 slices shown]
[im 1/83]
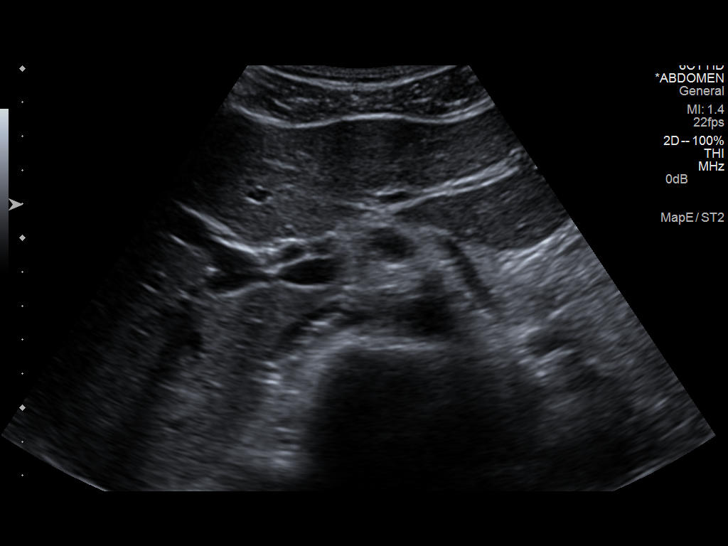
[im 7/83]
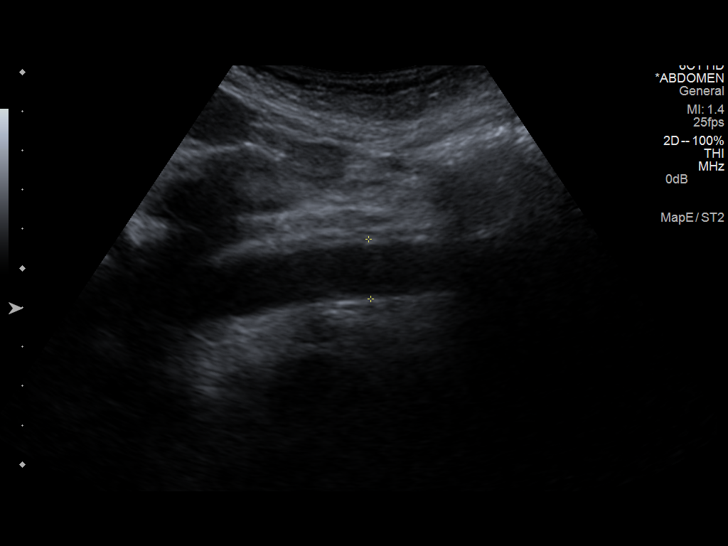
[im 14/83]
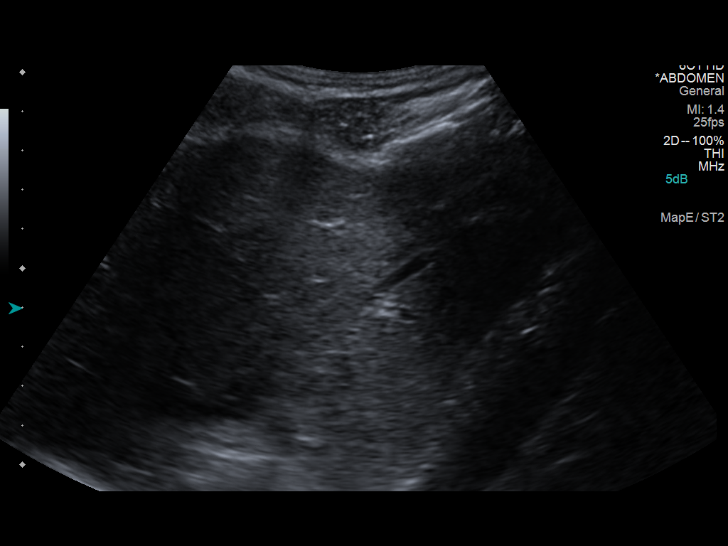
[im 21/83]
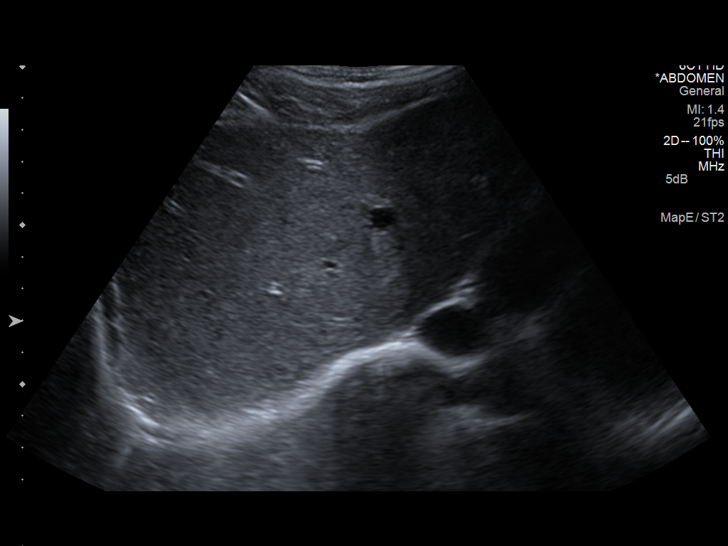
[im 28/83]
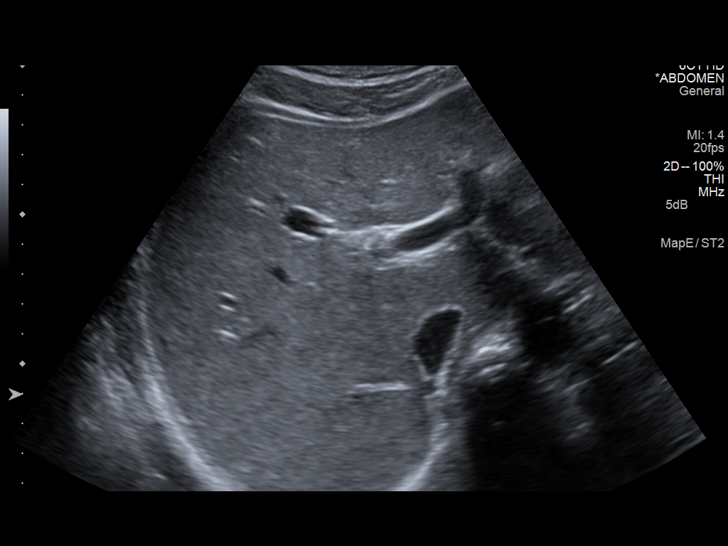
[im 31/83]
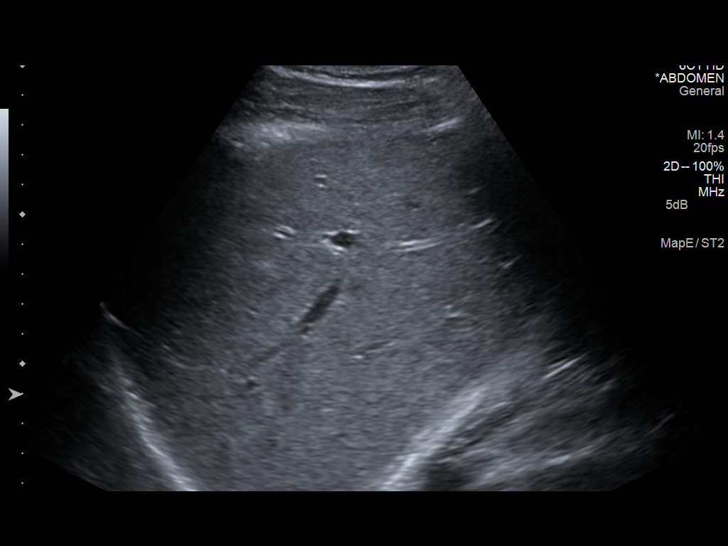
[im 38/83]
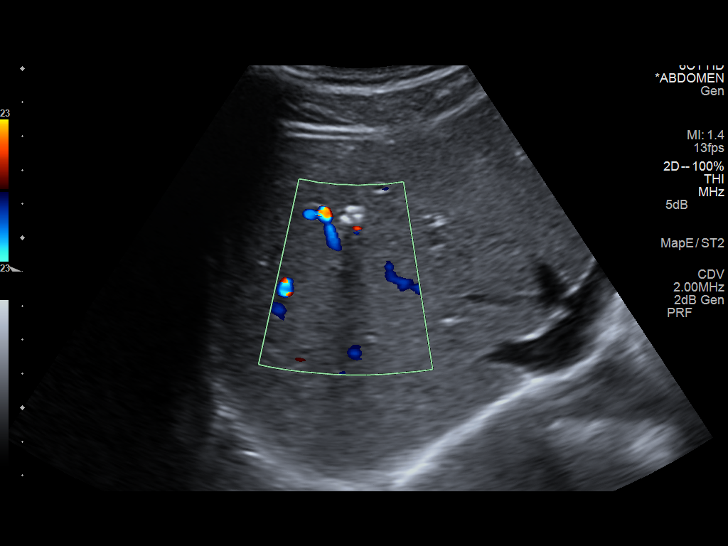
[im 45/83]
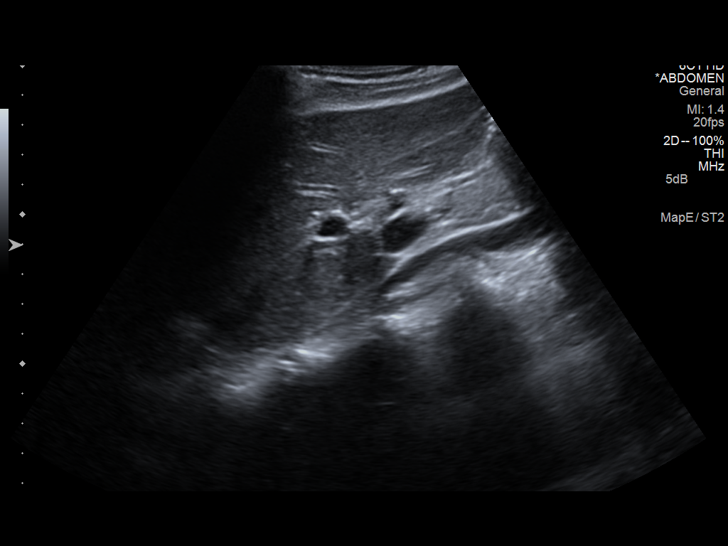
[im 52/83]
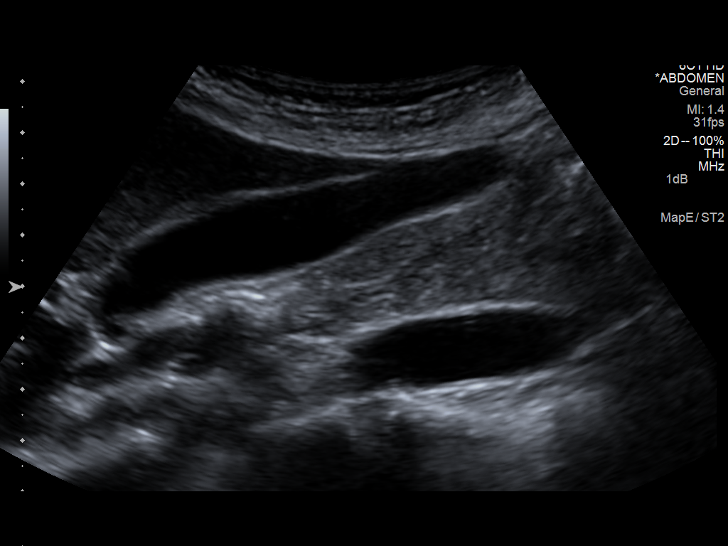
[im 55/83]
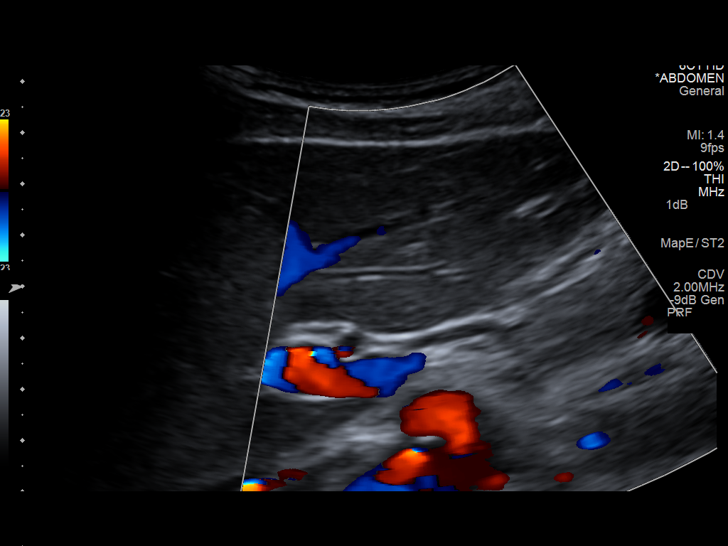
[im 62/83]
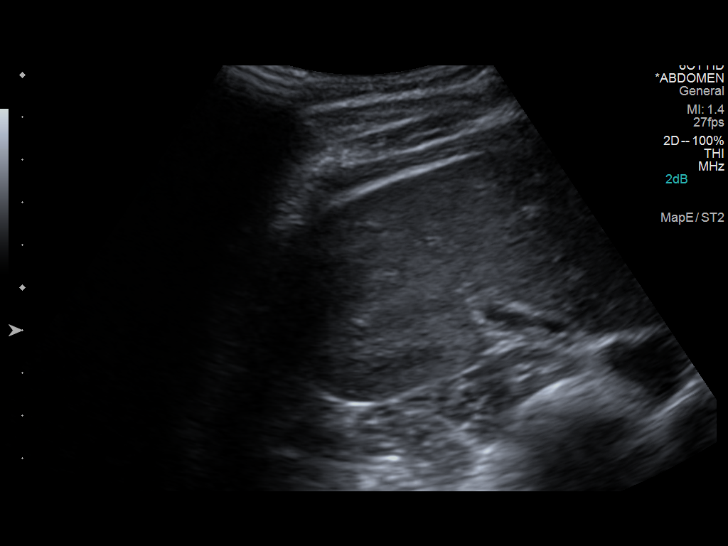
[im 69/83]
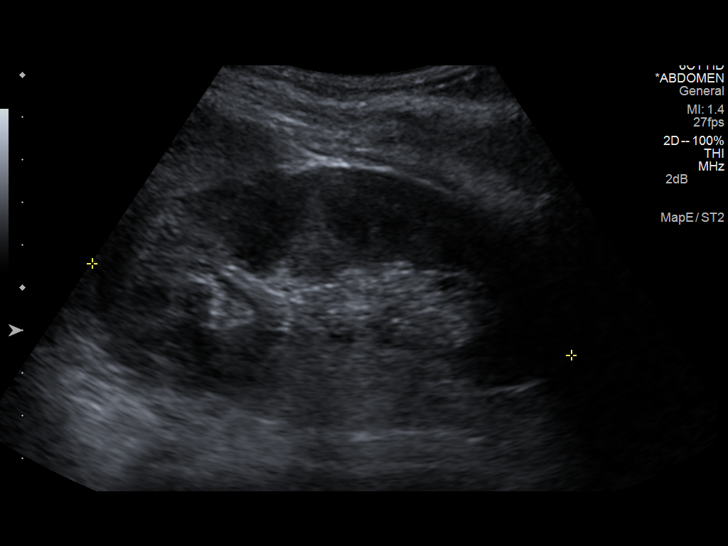
[im 76/83]
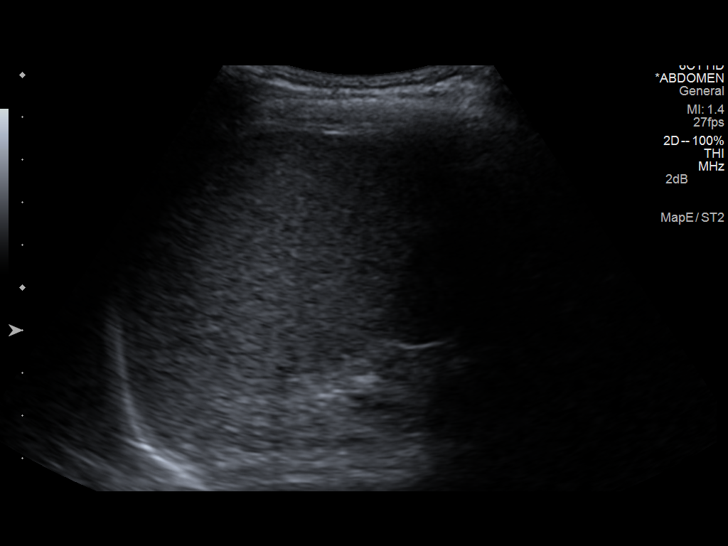
[im 83/83]
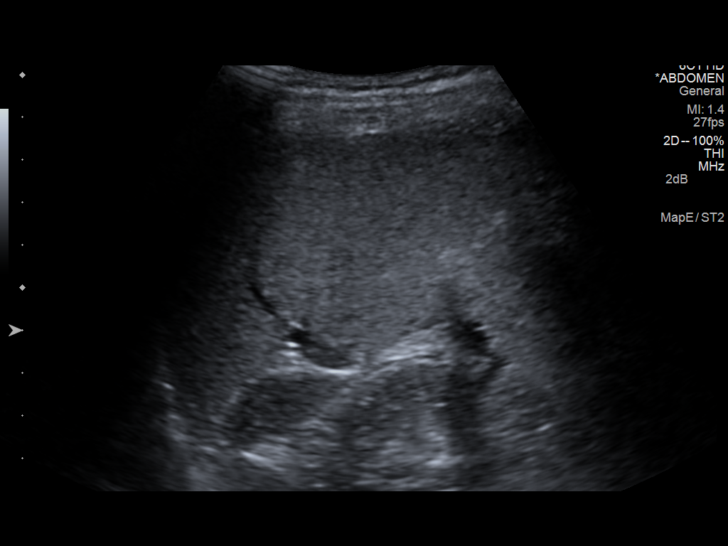

[14 of 25 positions shown; findings below may reference images not displayed]

FINDINGS: Gallbladder:

No gallstones or wall thickening visualized. No sonographic Murphy
sign noted.

Common bile duct:

Diameter: 2.8 mm

Liver:

Punctate echo density with posterior shadowing noted most likely
calcified granuloma. Liver otherwise unremarkable.

IVC:

No abnormality visualized.

Pancreas:

Visualized portion unremarkable.

Spleen:

Size and appearance within normal limits.

Right Kidney:

Length: 11.4 cm. Echogenicity within normal limits. No mass or
hydronephrosis visualized.

Left Kidney:

Length: 11.5 cm. Echogenicity within normal limits. No mass or
hydronephrosis visualized.

Abdominal aorta:

No aneurysm visualized.

Other findings:

None.
IMPRESSION: No significant abnormality.

## 2014-02-28 MED ORDER — HYDROCODONE-HOMATROPINE 5-1.5 MG/5ML PO SYRP: 5.0000 mL | ORAL_SOLUTION | ORAL | Status: DC | PRN

## 2014-02-28 MED ORDER — METHYLPREDNISOLONE ACETATE 80 MG/ML IJ SUSP
120.0000 mg | Freq: Once | INTRAMUSCULAR | Status: AC
Start: 2014-02-28 — End: 2014-02-28
  Administered 2014-02-28: 120 mg via INTRAMUSCULAR

## 2014-02-28 NOTE — Progress Notes (Signed)
Pre visit review using our clinic review tool, if applicable. No additional management support is needed unless otherwise documented below in the visit note. 

## 2014-02-28 NOTE — Progress Notes (Signed)
   Subjective:    Patient ID: Susan Foster, female    DOB: 1971-06-15, 43 y.o.   MRN: [id number]  HPI Here for 6 days of stuffy head, PND, ST, chest tightness and hard deep coughing that produces green sputum. She saw Urgent Care 3 days ago and was given a Zpack. She has taken this for 3 days. She was told to use Delsym for the cough and was given a Proair inhaler. Her ST is better but the cough is still a problem. She cannot sleep due to coughing all night. We have discussed the possibility that she has asthma before and she is set to see Dr. Mosetta Anis for this next week.    Review of Systems  Constitutional: Negative.   HENT: Positive for congestion and postnasal drip.   Eyes: Negative.   Respiratory: Positive for cough, chest tightness and wheezing. Negative for shortness of breath.   Cardiovascular: Negative.        Objective:   Physical Exam  Constitutional: She appears well-developed and well-nourished. No distress.  HENT:  Right Ear: External ear normal.  Left Ear: External ear normal.  Nose: Nose normal.  Mouth/Throat: Oropharynx is clear and moist.  Eyes: Conjunctivae are normal.  Pulmonary/Chest: Effort normal and breath sounds normal. No respiratory distress. She has no wheezes. She has no rales.  Lymphadenopathy:    She has no cervical adenopathy.          Assessment & Plan:  Given a steroid shot and some Hydromet for the cough. She will finish out the [place]. Recheck prn

## 2014-03-14 ENCOUNTER — Ambulatory Visit (INDEPENDENT_AMBULATORY_CARE_PROVIDER_SITE_OTHER): Payer: BC Managed Care – PPO | Admitting: Family Medicine

## 2014-03-14 ENCOUNTER — Encounter: Payer: Self-pay | Admitting: Family Medicine

## 2014-03-14 VITALS — BP 108/62 | HR 70 | Temp 98.5°F | Ht 63.5 in | Wt 122.0 lb

## 2014-03-14 DIAGNOSIS — J309 Allergic rhinitis, unspecified: Secondary | ICD-10-CM

## 2014-03-14 DIAGNOSIS — J45909 Unspecified asthma, uncomplicated: Secondary | ICD-10-CM

## 2014-03-14 NOTE — Progress Notes (Signed)
   Subjective:    Patient ID: Susan Foster, female    DOB: 30-Jan-1971, 43 y.o.   MRN: [id number]  HPI Here for follow up of URI sx. She finished the Zpack she had and she feels much better as far as her chest congestion and coughing goes. Now she still has some sinus congestion and is blowing out clear or yellow mucus from the  Nose. No fever. She will see Dr. Donneta Romberg this week for an allergy evaluation.    Review of Systems  Constitutional: Negative.   HENT: Positive for congestion, postnasal drip and rhinorrhea.   Eyes: Negative.   Respiratory: Negative.        Objective:   Physical Exam  Constitutional: She appears well-developed and well-nourished.  HENT:  Right Ear: External ear normal.  Left Ear: External ear normal.  Nose: Nose normal.  Mouth/Throat: Oropharynx is clear and moist.  Eyes: Conjunctivae are normal.  Pulmonary/Chest: Effort normal and breath sounds normal.  Lymphadenopathy:    She has no cervical adenopathy.          Assessment & Plan:  It seems that her infection has been eliminated and she is now dealing with her underlying allergies. See what Dr. Donneta Romberg has to offer

## 2014-03-14 NOTE — Progress Notes (Signed)
Pre visit review using our clinic review tool, if applicable. No additional management support is needed unless otherwise documented below in the visit note. 

## 2014-09-13 ENCOUNTER — Emergency Department (HOSPITAL_COMMUNITY)
Admission: EM | Admit: 2014-09-13 | Discharge: 2014-09-13 | Disposition: A | Discharge status: Home / Self Care | Payer: BC Managed Care – PPO

## 2014-09-15 ENCOUNTER — Other Ambulatory Visit: Payer: Self-pay | Admitting: Orthopedic Surgery

## 2014-09-15 ENCOUNTER — Ambulatory Visit (INDEPENDENT_AMBULATORY_CARE_PROVIDER_SITE_OTHER): Payer: Self-pay | Admitting: Family Medicine

## 2014-09-15 ENCOUNTER — Encounter: Payer: Self-pay | Admitting: Family Medicine

## 2014-09-15 VITALS — BP 103/57 | HR 69 | Temp 98.5°F | Ht 63.5 in | Wt 123.0 lb

## 2014-09-15 DIAGNOSIS — S161XXA Strain of muscle, fascia and tendon at neck level, initial encounter: Secondary | ICD-10-CM

## 2014-09-15 DIAGNOSIS — M25511 Pain in right shoulder: Secondary | ICD-10-CM

## 2014-09-15 DIAGNOSIS — M25311 Other instability, right shoulder: Secondary | ICD-10-CM

## 2014-09-15 MED ORDER — CYCLOBENZAPRINE HCL 10 MG PO TABS: 10.0000 mg | ORAL_TABLET | Freq: Three times a day (TID) | ORAL | Status: DC | PRN

## 2014-09-15 MED ORDER — BUDESONIDE-FORMOTEROL FUMARATE 160-4.5 MCG/ACT IN AERO: 2.0000 | INHALATION_SPRAY | Freq: Two times a day (BID) | RESPIRATORY_TRACT | Status: DC | PRN

## 2014-09-15 MED ORDER — DICLOFENAC SODIUM 75 MG PO TBEC: 75.0000 mg | DELAYED_RELEASE_TABLET | Freq: Two times a day (BID) | ORAL | Status: DC

## 2014-09-15 MED ORDER — ALBUTEROL SULFATE HFA 108 (90 BASE) MCG/ACT IN AERS: 2.0000 | INHALATION_SPRAY | RESPIRATORY_TRACT | Status: DC | PRN
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
## 2014-09-15 NOTE — Progress Notes (Signed)
Pre visit review using our clinic review tool, if applicable. No additional management support is needed unless otherwise documented below in the visit note. 

## 2014-09-27 ENCOUNTER — Ambulatory Visit
Admission: RE | Admit: 2014-09-27 | Discharge: 2014-09-27 | Disposition: A | Discharge status: Home / Self Care | Payer: BC Managed Care – PPO | Source: Ambulatory Visit | Attending: Orthopedic Surgery | Admitting: Orthopedic Surgery

## 2014-09-27 DIAGNOSIS — M25511 Pain in right shoulder: Secondary | ICD-10-CM

## 2014-09-27 DIAGNOSIS — M25311 Other instability, right shoulder: Secondary | ICD-10-CM

## 2014-09-27 MED ORDER — IOHEXOL 180 MG/ML  SOLN
11.0000 mL | Freq: Once | INTRAMUSCULAR | Status: AC | PRN
  Administered 2014-09-27: 11 mL via INTRA_ARTICULAR

## 2014-10-03 ENCOUNTER — Encounter: Payer: Self-pay | Admitting: Family Medicine

## 2014-10-03 ENCOUNTER — Ambulatory Visit (INDEPENDENT_AMBULATORY_CARE_PROVIDER_SITE_OTHER): Payer: BC Managed Care – PPO | Admitting: Family Medicine

## 2014-10-03 VITALS — BP 105/65 | HR 76 | Temp 98.5°F | Ht 63.5 in | Wt 122.0 lb

## 2014-10-03 DIAGNOSIS — J0100 Acute maxillary sinusitis, unspecified: Secondary | ICD-10-CM

## 2014-10-03 MED ORDER — AZITHROMYCIN 250 MG PO TABS: ORAL_TABLET | ORAL | Status: DC

## 2014-10-03 NOTE — Progress Notes (Signed)
Pre visit review using our clinic review tool, if applicable. No additional management support is needed unless otherwise documented below in the visit note. 

## 2014-10-03 NOTE — Progress Notes (Signed)
   Subjective:    Patient ID: Susan Foster, female    DOB: [birth year], 43 y.o.   MRN: [id number]  HPI Here for 4 days of sinus pressure, PND, ST , and a dry cough.    Review of Systems  Constitutional: Negative.   HENT: Positive for congestion, postnasal drip and sinus pressure.   Eyes: Negative.   Respiratory: Positive for cough.        Objective:   Physical Exam  Constitutional: She appears well-developed and well-nourished.  HENT:  Right Ear: External ear normal.  Left Ear: External ear normal.  Nose: Nose normal.  Mouth/Throat: Oropharynx is clear and moist.  Eyes: Conjunctivae are normal.  Pulmonary/Chest: Effort normal and breath sounds normal.  Lymphadenopathy:    She has no cervical adenopathy.          Assessment & Plan:  Add Mucinex

## 2015-05-25 ENCOUNTER — Telehealth: Payer: Self-pay | Admitting: Family Medicine

## 2015-05-25 NOTE — Telephone Encounter (Signed)
Pt is 44 years old and would like to know all the age appropriate test she should be having.

## 2015-05-26 NOTE — Telephone Encounter (Signed)
Mammogram, Pap, and labs like glucose and cholesterol screens. Once she turns 49 we start colonoscopies

## 2015-05-29 NOTE — Telephone Encounter (Signed)
I left a voice message with below information. 

## 2015-06-28 ENCOUNTER — Ambulatory Visit (INDEPENDENT_AMBULATORY_CARE_PROVIDER_SITE_OTHER): Payer: BLUE CROSS/BLUE SHIELD | Admitting: Adult Health

## 2015-06-28 ENCOUNTER — Encounter: Payer: Self-pay | Admitting: Adult Health

## 2015-06-28 VITALS — BP 100/68 | Temp 98.2°F | Ht 63.5 in | Wt 127.7 lb

## 2015-06-28 DIAGNOSIS — G589 Mononeuropathy, unspecified: Secondary | ICD-10-CM | POA: Diagnosis not present

## 2015-06-28 MED ORDER — CYCLOBENZAPRINE HCL 5 MG PO TABS: 5.0000 mg | ORAL_TABLET | Freq: Three times a day (TID) | ORAL | Status: DC | PRN

## 2015-06-28 NOTE — Patient Instructions (Addendum)
It appears as though you have a pinched nerve. Please take the Flexeril ( muscle relaxer) and use Iburprofen 600mg  as well as ice and rest. If you do not notice a difference in the next 2-3 days please let me know.   Pinched Nerve The term pinched nerve describes one type of damage or injury to a nerve or set of nerves. Pinched nerves can sometimes lead to other conditions. These include peripheral neuropathy, carpal tunnel syndrome, and tennis elbow. The extent of such injuries may vary from minor, temporary damage to a more permanent condition. Early diagnosis is important to prevent further damage or complications. Pinched nerve is a common cause of on-the-job injury. CAUSES  The injury may result from:  Compression.  Constriction.  Stretching. SYMPTOMS  Symptoms include:  Numbness.  "Pins and needles" or burning sensations.  Pain radiating outward from the injured area.  One of the most common examples of a single compressed nerve is the feeling of having a foot or hand "fall asleep." TREATMENT  The most often recommended treatment for pinched nerve is rest for the affected area. Corticosteroids help alleviate pain. In some cases, surgery is recommended. Physical therapy may be recommended. Splints or collars may be used. With treatment, most people recover from pinched nerve. In some cases, the damage is irreversible. Document Released: 10/18/2002 Document Revised: 01/20/2012 Document Reviewed: 10/05/2008 [facility] Patient Information 2015 [place], [place]. This information is not intended to replace advice given to you by your health care provider. Make sure you discuss any questions you have with your health care provider.

## 2015-06-28 NOTE — Progress Notes (Signed)
Pre visit review using our clinic review tool, if applicable. No additional management support is needed unless otherwise documented below in the visit note. 
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
## 2015-09-28 ENCOUNTER — Encounter: Payer: Self-pay | Admitting: Family Medicine

## 2015-09-28 ENCOUNTER — Ambulatory Visit (INDEPENDENT_AMBULATORY_CARE_PROVIDER_SITE_OTHER): Payer: BLUE CROSS/BLUE SHIELD | Admitting: Family Medicine

## 2015-09-28 VITALS — BP 113/64 | HR 79 | Temp 98.6°F | Ht 63.5 in | Wt 131.0 lb

## 2015-09-28 DIAGNOSIS — J019 Acute sinusitis, unspecified: Secondary | ICD-10-CM

## 2015-09-28 MED ORDER — AZITHROMYCIN 250 MG PO TABS: ORAL_TABLET | ORAL | Status: DC

## 2015-09-28 MED ORDER — HYDROCODONE-HOMATROPINE 5-1.5 MG/5ML PO SYRP: 5.0000 mL | ORAL_SOLUTION | ORAL | Status: DC | PRN

## 2015-09-28 NOTE — Progress Notes (Signed)
Pre visit review using our clinic review tool, if applicable. No additional management support is needed unless otherwise documented below in the visit note. 

## 2015-09-28 NOTE — Progress Notes (Signed)
   Subjective:    Patient ID: Susan Foster, female    DOB: 1971-09-24, 44 y.o.   MRN: [id number]  HPI Here for one week of sinus pressure, PND, HA, and a dry cough. Last night she developed hoarseness. On Sudafed.    Review of Systems  Constitutional: Negative.   HENT: Positive for congestion, postnasal drip, sinus pressure, sore throat and voice change. Negative for ear pain.   Eyes: Negative.   Respiratory: Positive for cough.        Objective:   Physical Exam  Constitutional: She appears well-developed and well-nourished.  HENT:  Right Ear: External ear normal.  Left Ear: External ear normal.  Nose: Nose normal.  Mouth/Throat: Oropharynx is clear and moist.  Eyes: Conjunctivae are normal.  Neck: No thyromegaly present.  Pulmonary/Chest: Effort normal and breath sounds normal.  Lymphadenopathy:    She has no cervical adenopathy.          Assessment & Plan:  Sinusitis, treat with a Zpack

## 2015-11-12 DIAGNOSIS — G9050 Complex regional pain syndrome I, unspecified: Secondary | ICD-10-CM

## 2015-11-12 HISTORY — DX: Complex regional pain syndrome I, unspecified: G90.50

## 2016-01-26 ENCOUNTER — Ambulatory Visit (INDEPENDENT_AMBULATORY_CARE_PROVIDER_SITE_OTHER): Payer: BLUE CROSS/BLUE SHIELD | Admitting: Family Medicine

## 2016-01-26 ENCOUNTER — Encounter: Payer: Self-pay | Admitting: Family Medicine

## 2016-01-26 VITALS — BP 98/54 | HR 79 | Temp 100.5°F | Ht 63.5 in | Wt 129.0 lb

## 2016-01-26 DIAGNOSIS — R509 Fever, unspecified: Secondary | ICD-10-CM

## 2016-01-26 DIAGNOSIS — J111 Influenza due to unidentified influenza virus with other respiratory manifestations: Secondary | ICD-10-CM | POA: Diagnosis not present

## 2016-01-26 LAB — POCT INFLUENZA A/B
INFLUENZA B, POC: POSITIVE — AB
Influenza A, POC: POSITIVE — AB

## 2016-01-26 NOTE — Progress Notes (Signed)
   Subjective:    Patient ID: Susan Foster, female    DOB: 08-20-1971, 45 y.o.   MRN: [id number]  HPI Here for 3 days of fevers, body aches, and a headache. No ST or cough or NVD. Drinking fluids.    Review of Systems  Constitutional: Positive for fever, chills and diaphoresis.  HENT: Negative.   Eyes: Negative.   Respiratory: Negative.   Gastrointestinal: Negative.   Neurological: Negative.        Objective:   Physical Exam  Constitutional: She is oriented to person, place, and time.  Appears ill   HENT:  Right Ear: External ear normal.  Left Ear: External ear normal.  Nose: Nose normal.  Mouth/Throat: Oropharynx is clear and moist.  Eyes: Conjunctivae are normal.  Neck: No thyromegaly present.  Pulmonary/Chest: Effort normal and breath sounds normal. No respiratory distress. She has no wheezes. She has no rales.  Lymphadenopathy:    She has no cervical adenopathy.  Neurological: She is alert and oriented to person, place, and time.          Assessment & Plan:  Influenza. Use Ibuprofen prn and drink fluids. Recheck prn

## 2016-01-26 NOTE — Progress Notes (Signed)
Pre visit review using our clinic review tool, if applicable. No additional management support is needed unless otherwise documented below in the visit note. 

## 2016-04-09 ENCOUNTER — Telehealth: Payer: Self-pay | Admitting: Family Medicine

## 2016-04-09 NOTE — Telephone Encounter (Signed)
Patient Name: Susan Foster DOB: 11/07/1971 Initial Comment Caller states was bit by a friend's dog on Sat, it's shots are up to date, did not break skin, but bruised bad Nurse Assessment Nurse: Roosvelt Maser, RN, Barnetta Chapel Date/Time (Eastern Time): 04/09/2016 9:51:10 AM Confirm and document reason for call. If symptomatic, describe symptoms. You must click the next button to save text entered. ---bit by friends friends dog saturday back of leg 0/10 pain, did not break the skin, did leave a bruise. not sure if there is anything i should be worried about? Has the patient traveled out of the country within the last 30 days? ---Not Applicable Does the patient have any new or worsening symptoms? ---Yes Will a triage be completed? ---Yes Related visit to physician within the last 2 weeks? ---No Does the PT have any chronic conditions? (i.e. diabetes, asthma, etc.) ---No Is the patient pregnant or possibly pregnant? (Ask all females between the ages of 72-55) ---No Is this a behavioral health or substance abuse call? ---No Guidelines Guideline Title Affirmed Question Affirmed Notes Animal Bite Bite that didn't break the skin (all triage questions negative) Final Disposition User Clifton Hill, RN, Barnetta Chapel Disagree/Comply: Comply

## 2016-04-09 NOTE — Telephone Encounter (Signed)
FYI.  Home Care Advise Given

## 2016-06-11 DIAGNOSIS — L821 Other seborrheic keratosis: Secondary | ICD-10-CM | POA: Diagnosis not present

## 2016-06-11 DIAGNOSIS — D235 Other benign neoplasm of skin of trunk: Secondary | ICD-10-CM | POA: Diagnosis not present

## 2016-06-11 DIAGNOSIS — D1801 Hemangioma of skin and subcutaneous tissue: Secondary | ICD-10-CM | POA: Diagnosis not present

## 2016-06-11 DIAGNOSIS — L905 Scar conditions and fibrosis of skin: Secondary | ICD-10-CM | POA: Diagnosis not present

## 2016-07-18 DIAGNOSIS — H43393 Other vitreous opacities, bilateral: Secondary | ICD-10-CM | POA: Diagnosis not present

## 2016-08-16 DIAGNOSIS — N9089 Other specified noninflammatory disorders of vulva and perineum: Secondary | ICD-10-CM | POA: Diagnosis not present

## 2016-08-16 DIAGNOSIS — Z6822 Body mass index (BMI) 22.0-22.9, adult: Secondary | ICD-10-CM | POA: Diagnosis not present

## 2016-10-14 ENCOUNTER — Telehealth: Payer: Self-pay | Admitting: Family Medicine

## 2016-10-14 ENCOUNTER — Emergency Department (HOSPITAL_COMMUNITY)
Admission: EM | Admit: 2016-10-14 | Discharge: 2016-10-14 | Disposition: A | Discharge status: Home / Self Care | Payer: Worker's Compensation | Attending: Emergency Medicine | Admitting: Emergency Medicine

## 2016-10-14 ENCOUNTER — Encounter (HOSPITAL_COMMUNITY): Payer: Self-pay | Admitting: *Deleted

## 2016-10-14 DIAGNOSIS — S161XXA Strain of muscle, fascia and tendon at neck level, initial encounter: Secondary | ICD-10-CM | POA: Diagnosis not present

## 2016-10-14 DIAGNOSIS — Y999 Unspecified external cause status: Secondary | ICD-10-CM | POA: Diagnosis not present

## 2016-10-14 DIAGNOSIS — S0990XA Unspecified injury of head, initial encounter: Secondary | ICD-10-CM | POA: Diagnosis not present

## 2016-10-14 DIAGNOSIS — Y92410 Unspecified street and highway as the place of occurrence of the external cause: Secondary | ICD-10-CM | POA: Diagnosis not present

## 2016-10-14 DIAGNOSIS — Y939 Activity, unspecified: Secondary | ICD-10-CM | POA: Insufficient documentation

## 2016-10-14 DIAGNOSIS — S060X0A Concussion without loss of consciousness, initial encounter: Secondary | ICD-10-CM | POA: Insufficient documentation

## 2016-10-14 MED ORDER — HYDROCODONE-ACETAMINOPHEN 5-325 MG PO TABS: 1.0000 | ORAL_TABLET | Freq: Four times a day (QID) | ORAL | 0 refills | Status: DC | PRN

## 2016-10-14 MED ORDER — ONDANSETRON 4 MG PO TBDP
ORAL_TABLET | ORAL | Status: AC
  Filled 2016-10-14: qty 1

## 2016-10-14 MED ORDER — CYCLOBENZAPRINE HCL 10 MG PO TABS
10.0000 mg | ORAL_TABLET | Freq: Once | ORAL | Status: AC
  Administered 2016-10-14: 10 mg via ORAL
  Filled 2016-10-14: qty 1

## 2016-10-14 MED ORDER — ONDANSETRON 4 MG PO TBDP
4.0000 mg | ORAL_TABLET | Freq: Once | ORAL | Status: AC
  Administered 2016-10-14: 4 mg via ORAL

## 2016-10-14 MED ORDER — CYCLOBENZAPRINE HCL 10 MG PO TABS: 10.0000 mg | ORAL_TABLET | Freq: Two times a day (BID) | ORAL | 0 refills | Status: DC | PRN
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
## 2016-10-14 NOTE — Telephone Encounter (Signed)
Pt has checked in to [facility] ED for evaluation/treatment.
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
## 2016-10-15 ENCOUNTER — Emergency Department (HOSPITAL_COMMUNITY)
Admission: EM | Admit: 2016-10-15 | Discharge: 2016-10-15 | Disposition: A | Discharge status: Home / Self Care | Payer: Worker's Compensation | Attending: Emergency Medicine | Admitting: Emergency Medicine

## 2016-10-15 ENCOUNTER — Emergency Department (HOSPITAL_COMMUNITY): Payer: Worker's Compensation

## 2016-10-15 ENCOUNTER — Encounter (HOSPITAL_COMMUNITY): Payer: Self-pay | Admitting: Emergency Medicine

## 2016-10-15 DIAGNOSIS — S0990XD Unspecified injury of head, subsequent encounter: Secondary | ICD-10-CM | POA: Diagnosis not present

## 2016-10-15 DIAGNOSIS — R42 Dizziness and giddiness: Secondary | ICD-10-CM | POA: Diagnosis not present

## 2016-10-15 DIAGNOSIS — S060X0D Concussion without loss of consciousness, subsequent encounter: Secondary | ICD-10-CM | POA: Diagnosis not present

## 2016-10-15 DIAGNOSIS — S199XXA Unspecified injury of neck, initial encounter: Secondary | ICD-10-CM | POA: Diagnosis not present

## 2016-10-15 NOTE — Telephone Encounter (Signed)
Pt would like to have a call back.

## 2016-10-15 NOTE — Telephone Encounter (Signed)
Spoke with pt and she states that she is feeling more sore and still having some strange sensations in her head. She was diagnosed with a concussion. Pt reports some increase in decreased mental status, speech patterns, slow motion movement, pain above left ear and a pressure sensation in her head and face. She denies any vision problems.   Advised pt that I would talk with Dr Sarajane Jews for recommendations and call her back. Pt agreed.   Spoke with Dr Sarajane Jews and he recommends pt return to ED for evaluation and CT head to r/o bleed. Spoke with pt and advised. She agrees and is going to [facility] ED for eval.   Nothing further needed.
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
## 2016-10-17 ENCOUNTER — Telehealth: Payer: Self-pay | Admitting: Family Medicine
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
## 2016-10-17 NOTE — Telephone Encounter (Signed)
FYI.  Pt coming 10/18/16 @ 2 PM

## 2016-10-18 ENCOUNTER — Ambulatory Visit (INDEPENDENT_AMBULATORY_CARE_PROVIDER_SITE_OTHER): Payer: Worker's Compensation | Admitting: Family Medicine

## 2016-10-18 VITALS — BP 96/54 | HR 71 | Temp 98.3°F | Ht 63.0 in | Wt 129.0 lb

## 2016-10-18 DIAGNOSIS — S060X0D Concussion without loss of consciousness, subsequent encounter: Secondary | ICD-10-CM | POA: Diagnosis not present

## 2016-10-18 NOTE — Progress Notes (Signed)
Pre visit review using our clinic review tool, if applicable. No additional management support is needed unless otherwise documented below in the visit note. 

## 2016-10-21 ENCOUNTER — Telehealth: Payer: Self-pay | Admitting: Family Medicine

## 2016-10-21 ENCOUNTER — Encounter: Payer: Self-pay | Admitting: Family Medicine
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
## 2016-10-21 NOTE — Telephone Encounter (Signed)
Pt saw dr fry on 10-18-16 and per pt was told if she had an additional questions to  callback and dr fry will call her

## 2016-10-21 NOTE — Telephone Encounter (Signed)
We did a referral to neurology. Please call her and see what she needs

## 2016-10-22 NOTE — Telephone Encounter (Signed)
I spoke with pt and she has appointment with neurology at 8 am on 10/23/2016.

## 2016-10-23 ENCOUNTER — Ambulatory Visit (INDEPENDENT_AMBULATORY_CARE_PROVIDER_SITE_OTHER): Payer: Worker's Compensation | Admitting: Neurology

## 2016-10-23 ENCOUNTER — Encounter: Payer: Self-pay | Admitting: Neurology

## 2016-10-23 VITALS — BP 100/64 | HR 107 | Ht 63.0 in | Wt 126.4 lb

## 2016-10-23 DIAGNOSIS — F4310 Post-traumatic stress disorder, unspecified: Secondary | ICD-10-CM | POA: Diagnosis not present

## 2016-10-23 DIAGNOSIS — S161XXA Strain of muscle, fascia and tendon at neck level, initial encounter: Secondary | ICD-10-CM | POA: Diagnosis not present

## 2016-10-23 DIAGNOSIS — M549 Dorsalgia, unspecified: Secondary | ICD-10-CM | POA: Diagnosis not present

## 2016-10-23 DIAGNOSIS — S060X0A Concussion without loss of consciousness, initial encounter: Secondary | ICD-10-CM | POA: Diagnosis not present
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
## 2016-10-23 NOTE — Patient Instructions (Signed)
Neurologically, I see no abnormalities.  The primary issue is treating the neck and back pain.  I would like to refer you to Dr. Hulan Saas, a Sports Medicine doctor, who treats neck and spine issues, as well as concussion.

## 2016-10-24 ENCOUNTER — Telehealth: Payer: Self-pay | Admitting: Neurology

## 2016-10-24 NOTE — Telephone Encounter (Signed)
Patient needs to talk to you about her appt to see dr Susan Foster on 11-14-16 call [phone]

## 2016-10-25 ENCOUNTER — Telehealth: Payer: Self-pay | Admitting: Neurology

## 2016-10-25 ENCOUNTER — Telehealth: Payer: Self-pay | Admitting: Family Medicine

## 2016-10-25 DIAGNOSIS — M542 Cervicalgia: Secondary | ICD-10-CM

## 2016-10-25 NOTE — Telephone Encounter (Signed)
See other encounter.

## 2016-10-25 NOTE — Telephone Encounter (Signed)
done

## 2016-10-25 NOTE — Telephone Encounter (Signed)
I spoke with pt and she would like to know if she might benefit from physical therapy until she get's in to see Dr. Tamala Julian. Also she will drop off form to sign for being absent from work.

## 2016-10-25 NOTE — Telephone Encounter (Signed)
We will extend her work absence until 11-14-16 when she sees Dr. Tamala Julian

## 2016-10-25 NOTE — Telephone Encounter (Signed)
Pt states dr Sarajane Jews has her out of work until 12/18. Pt saw Dr Tomi Likens, no neurological damage.  Has now referred pt to Dr Tamala Julian for back and neck injury. Pt cannot see Dr Tamala Julian until 11/14/2016. So pt would like to know if Dr Sarajane Jews wants her to continue to be out of work until 11/14/16?  Pt not sure if she can do all day at work.  Pt states her speech has gotten better, however when she talks a lot, the pressure begins to build up in the back of her head.  Pt stilling dealing with effects of having a concussion. Pt still having much tightness in her neck.

## 2016-10-25 NOTE — Telephone Encounter (Signed)
Susan Foster 07/15/1971. She is scheduled to see Dr. Hulan Saas [facility] referred her to) on 11/14/16 for her back. Her # [phone]. She is having a lot of trouble with her back and wanted to know did Dr. Tomi Likens intend on her waiting until then or does she need something sooner? She also wanted to know who is now the Doctor that is overseeing her? She is wondering if it is Dr. Tomi Likens, her PCP or Dr. Tamala Julian? Thank you

## 2016-10-25 NOTE — Telephone Encounter (Signed)
Spoke with patient. Advised that appointment within a month, especially around the holidays was a good turn around time. Pt asked who would be filling out her disability forms. Advised that Dr. Tomi Likens did not see any neurological issues, therefore it would Dr. Sarajane Jews until she see's Dr. Tamala Julian, then she can discuss with Dr. Tamala Julian.

## 2016-10-28 ENCOUNTER — Telehealth: Payer: Self-pay | Admitting: Family Medicine

## 2016-10-28 NOTE — Telephone Encounter (Signed)
She is scheduled to see Dr. Charlann Boxer on 11-14-16, and we don't know what treatments he may want to do. I would hold off on any PT until he can see her. I would let him advise her about returning to work also

## 2016-10-28 NOTE — Telephone Encounter (Signed)
Pt would like to see a Physical therapist she knows, jillian scholefield, sports medicine on [address]. Pt has seen her before and wants to know if that is ok for her to get referral so jillian can work on pt's neck.  Also, pt would like to know if she could return to work for 4 hrs a day to see how she does?

## 2016-10-29 ENCOUNTER — Ambulatory Visit: Payer: Worker's Compensation | Attending: Family Medicine | Admitting: Physical Therapy

## 2016-10-29 DIAGNOSIS — R293 Abnormal posture: Secondary | ICD-10-CM | POA: Insufficient documentation

## 2016-10-29 DIAGNOSIS — M542 Cervicalgia: Secondary | ICD-10-CM | POA: Diagnosis not present

## 2016-10-29 DIAGNOSIS — M6281 Muscle weakness (generalized): Secondary | ICD-10-CM | POA: Diagnosis not present

## 2016-10-29 NOTE — Telephone Encounter (Signed)
Left a message for a return call.
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
## 2016-10-30 NOTE — Telephone Encounter (Signed)
I spoke with pt and she actually went to therapy on 10/29/2016 and it did seem to help. She will discuss the work note when she see's Dr. Tamala Julian.

## 2016-10-31 ENCOUNTER — Ambulatory Visit: Payer: Worker's Compensation | Admitting: Physical Therapy

## 2016-10-31 DIAGNOSIS — M542 Cervicalgia: Secondary | ICD-10-CM

## 2016-10-31 DIAGNOSIS — M6281 Muscle weakness (generalized): Secondary | ICD-10-CM

## 2016-10-31 DIAGNOSIS — R293 Abnormal posture: Secondary | ICD-10-CM

## 2016-10-31 NOTE — Patient Instructions (Signed)
  RE-ALIGNMENT ROUTINE EXERCISES BASIC FOR POSTURAL CORRECTION   RE-ALIGNMENT Tips BENEFITS: 1.It helps to re-align the curves of the back and improve standing posture. 2.It allows the back muscles to rest and strengthen in preparation for more activity. FREQUENCY: Daily, even after weeks, months and years of more advanced exercises. START: 1.All exercises start in the same position: lying on the back, arms resting on the supporting surface, palms up and slightly away from the body, backs of hands down, knees bent, feet flat. 2.The head, neck, arms, and legs are supported according to specific instructions of your therapist. Copyright  VHI. All rights reserved.    1. Decompression Exercise: Basic.   Takes compression off the vertebral bodies; increases tolerance for lying on the back; helps relieve back pain   Lie on back on firm surface, knees bent, feet flat, arms turned up, out to sides (~35 degrees). Head neck and arms supported as necessary. Time _5-15__ minutes. Surface: floor     2. Shoulder Press  Strengthens upper back extensors and scapular retractors.   Press both shoulders down. Hold _2-3__ seconds. Repeat _3-5__ times. Surface: floor        3. Head Press With [place]  Strengthens neck extensors   Tuck chin SLIGHTLY toward chest, keep mouth closed. Feel weight on back of head. Increase weight by pressing head down. Hold _2-3__ seconds. Relax. Repeat 3-5___ times. Surface: floor   4. Leg Lengthener: stretches quadratus lumborum and hip flexors.  Strengthens quads and ankle dorsiflexors.  5. Press whole leg "into the sand"  5x right and left Ruben Im PT [facility] [address] [place], [place] [address] Phone # [phone] Fax [phone]
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
## 2016-11-07 ENCOUNTER — Encounter: Payer: Self-pay | Admitting: Physical Therapy

## 2016-11-07 ENCOUNTER — Ambulatory Visit: Payer: Worker's Compensation | Admitting: Physical Therapy

## 2016-11-07 DIAGNOSIS — M6281 Muscle weakness (generalized): Secondary | ICD-10-CM | POA: Diagnosis not present

## 2016-11-07 DIAGNOSIS — M542 Cervicalgia: Secondary | ICD-10-CM

## 2016-11-07 DIAGNOSIS — R293 Abnormal posture: Secondary | ICD-10-CM | POA: Diagnosis not present

## 2016-11-07 NOTE — Therapy (Signed)
[facility] [address] [place], [place], [address] Phone: [phone]   Fax:  [phone]  Physical Therapy Treatment  Patient Details  Name: Susan Foster MRN: [id number] Date of Birth: 08/24/1971 Referring Provider: Dr. Sarajane Jews  Encounter Date: 11/07/2016      PT End of Session - 11/07/16 1441    Visit Number 3   Date for PT Re-Evaluation 12/24/16   Authorization Type BCBS   PT Start Time 1400   PT Stop Time 1510   PT Time Calculation (min) 70 min   Activity Tolerance Patient limited by pain   Behavior During Therapy [facility] for tasks assessed/performed      Past Medical History:  Diagnosis Date  . Allergy   . GERD (gastroesophageal reflux disease)     Past Surgical History:  Procedure Laterality Date  . bicep surgery Right   . fibroid turmor     removed left breast    There were no vitals filed for this visit.      Subjective Assessment - 11/07/16 1404    Subjective I felt good after having the tape on.  It came off last Tuesday and now the pain is back.    Pertinent History right shoulder surgery biceps tear 2015;  reports recent popping both shoulders   Limitations House hold activities   Diagnostic tests Had neuro check;     Patient Stated Goals get back to work;  bet back to working out 6-7 days a week [employer]   Currently in Pain? Yes   Pain Score 8    Pain Location Neck   Pain Orientation Right;Left   Pain Descriptors / Indicators Aching   Pain Type Acute pain   Pain Radiating Towards difficulty opening things secondary to weakness in hands   Pain Frequency Constant   Aggravating Factors  turning head   Pain Relieving Factors lying on back and letting things relax, muscle relaxer   Multiple Pain Sites No                         [facility] Adult PT Treatment/Exercise - 11/07/16 0001      Neck Exercises: Supine   Other Supine Exercise decompression series per HEP 5 reps       Modalities   Modalities Cryotherapy;Electrical Stimulation     Cryotherapy   Number Minutes Cryotherapy 20 Minutes   Cryotherapy Location Cervical   Type of Cryotherapy Ice pack     Electrical Stimulation   Electrical Stimulation Location cervical   Electrical Stimulation Action IFC   Electrical Stimulation Parameters To patient tolerance, 20 min   Electrical Stimulation Goals Pain     Manual Therapy   Manual Therapy Myofascial release;Soft tissue mobilization   Soft tissue mobilization very gentle soft tissue work to cervical musculature   Myofascial Release craniosacral release of the cranium and ear lobes   Kinesiotex --  for cervical support                PT Education - 11/07/16 1441    Education provided No          PT Short Term Goals - 11/07/16 1444      PT SHORT TERM GOAL #1   Title The patient will demonstrate knowledge of postural correction and active rest to promote healing  11/19/16   Time 4   Period Weeks   Status On-going     PT SHORT TERM GOAL #2  Title Cervical roation to 30 degrees, flexion and extension to 40 degrees and bilateral sidebending to 30 degrees needed for driving   Time 4   Period Weeks   Status On-going     PT SHORT TERM GOAL #3   Title The patient will have a 25% reduction in pain needed for return to work   Time 4   Period Weeks   Status On-going           PT Long Term Goals - 10/31/16 1217      PT LONG TERM GOAL #1   Title The patient will be independent in safe self progression of HEP needed for recovery of full ROM and strength   12/24/16   Time 8   Period Weeks   Status On-going     PT LONG TERM GOAL #2   Title The patient will have improved cervical rotation to 40 degrees needed for driving   Time 8   Period Weeks   Status On-going     PT LONG TERM GOAL #3   Title The patient will report a 60% improvement in pain with usual ADLs and work duties    Period Weeks   Status On-going     PT LONG TERM  GOAL #4   Title Grip strength improved to 25# bilaterally needed for gripping and lifting   Time 8   Period Weeks   Status On-going     PT LONG TERM GOAL #5   Title FOTO functional outcome score improved from 57% limitation to 34% indicating improved function with less pain   Time 8   Period Weeks   Status On-going               Plan - 11/07/16 1442    Clinical Impression Statement Patient contiunes to be in acute/severe neck pain.  She is only able to tolerate gentle soft tissue work and cranial therapy.  Patient does well with Kinsiotape.  Patient will benefit from skilled therapy to slowly progress her in therapy.    Rehab Potential Good   Clinical Impairments Affecting Rehab Potential none   PT Frequency 2x / week   PT Duration 8 weeks   PT Treatment/Interventions ADLs/Self Care Home Management;Cryotherapy;Electrical Stimulation;Ultrasound;Traction;Moist Heat;Therapeutic exercise;Patient/family education;Manual techniques;Dry needling;Taping   PT Next Visit Plan use phone to video kinesiotape;  modalities for pain control (e-stim/ice); gentle ROM of cervical, glenohumeral, scapular joints;  UE Ranger;  postural education to promote healing;  gentle soft tissue work   PT Home Exercise Plan progress as tolerated   Recommended Other Services None   Consulted and Agree with Plan of Care Patient      Patient will benefit from skilled therapeutic intervention in order to improve the following deficits and impairments:  Pain, Increased muscle spasms, Decreased range of motion, Decreased strength, Impaired flexibility, Impaired UE functional use  Visit Diagnosis: Cervicalgia  Muscle weakness (generalized)  Abnormal posture     Problem List Patient Active Problem List   Diagnosis Date Noted  . 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 INFLUENZA 12/13/2009  . GERD 04/24/2009  . 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 ALLERGIC RHINITIS 07/17/2007  . OVARIAN CYST, RUPTURED 07/17/2007     Earlie Counts, PT 11/07/16 2:45 PM   [facility] [address] [place], [place], [address] Phone: [phone]   Fax:  [phone]  Name: Susan Foster MRN: [id number] Date of Birth: Mar 30, 1971

## 2016-11-12 ENCOUNTER — Ambulatory Visit: Payer: Worker's Compensation | Attending: Family Medicine | Admitting: Physical Therapy

## 2016-11-12 DIAGNOSIS — M6281 Muscle weakness (generalized): Secondary | ICD-10-CM | POA: Diagnosis not present

## 2016-11-12 DIAGNOSIS — M542 Cervicalgia: Secondary | ICD-10-CM | POA: Diagnosis not present

## 2016-11-12 DIAGNOSIS — R293 Abnormal posture: Secondary | ICD-10-CM | POA: Diagnosis not present
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
## 2016-11-14 ENCOUNTER — Ambulatory Visit (INDEPENDENT_AMBULATORY_CARE_PROVIDER_SITE_OTHER): Payer: Worker's Compensation | Admitting: Family Medicine

## 2016-11-14 ENCOUNTER — Encounter: Payer: Self-pay | Admitting: Family Medicine

## 2016-11-14 ENCOUNTER — Ambulatory Visit: Payer: Worker's Compensation | Admitting: Physical Therapy

## 2016-11-14 DIAGNOSIS — M6281 Muscle weakness (generalized): Secondary | ICD-10-CM | POA: Diagnosis not present

## 2016-11-14 DIAGNOSIS — R293 Abnormal posture: Secondary | ICD-10-CM | POA: Diagnosis not present

## 2016-11-14 DIAGNOSIS — S134XXA Sprain of ligaments of cervical spine, initial encounter: Secondary | ICD-10-CM | POA: Insufficient documentation

## 2016-11-14 DIAGNOSIS — M542 Cervicalgia: Secondary | ICD-10-CM

## 2016-11-14 MED ORDER — GABAPENTIN 100 MG PO CAPS
200.0000 mg | ORAL_CAPSULE | Freq: Every day | ORAL | 3 refills | Status: DC
Start: 2016-11-14 — End: 2017-02-10

## 2016-11-14 MED ORDER — KETOROLAC TROMETHAMINE 60 MG/2ML IM SOLN
60.0000 mg | Freq: Once | INTRAMUSCULAR | Status: AC
  Administered 2016-11-14: 60 mg via INTRAMUSCULAR

## 2016-11-14 MED ORDER — VITAMIN D (ERGOCALCIFEROL) 1.25 MG (50000 UNIT) PO CAPS: 50000.0000 [IU] | ORAL_CAPSULE | ORAL | 0 refills | Status: DC

## 2016-11-14 MED ORDER — METHYLPREDNISOLONE ACETATE 80 MG/ML IJ SUSP
80.0000 mg | Freq: Once | INTRAMUSCULAR | Status: AC
  Administered 2016-11-14: 80 mg via INTRAMUSCULAR

## 2016-11-14 NOTE — Therapy (Signed)
[facility] [address], [place], [place], [address] Phone: [phone]   Fax:  [phone]  Physical Therapy Treatment  Patient Details  Name: Susan Foster MRN: [id number] Date of Birth: [birth year] Referring Provider: Dr. Sarajane Jews  Encounter Date: 11/14/2016      PT End of Session - 11/14/16 1354    Visit Number 5   Date for PT Re-Evaluation 12/24/16   Authorization Type BCBS   PT Start Time 1145   PT Stop Time 1240   PT Time Calculation (min) 55 min   Activity Tolerance Patient tolerated treatment well      Past Medical History:  Diagnosis Date  . Allergy   . GERD (gastroesophageal reflux disease)     Past Surgical History:  Procedure Laterality Date  . bicep surgery Right   . fibroid turmor     removed left breast    There were no vitals filed for this visit.      Subjective Assessment - 11/14/16 1146    Subjective Drove yesterday and today for the first time.  I can feel it in my back.  My neck is stiff.  Difficulty turning to the left.  Worked a few hours this morning.  Used my TENs unit on my low back which helped a lot.   Using pillow behind my back while typing on the computer.  Low back and buttock pain since driving here.     Currently in Pain? Yes   Pain Score 8    Pain Location Back   Pain Orientation Lower   Pain Radiating Towards buttocks   Aggravating Factors  driving, turning head            [facility] PT Assessment - 11/14/16 0001      AROM   Cervical Flexion 33   Cervical Extension 20   Cervical - Right Side Bend 35   Cervical - Left Side Bend 20   Cervical - Right Rotation 40   Cervical - Left Rotation 40     Strength   Overall Strength Comments 22# grip bilaterally                     [facility] Adult PT Treatment/Exercise - 11/14/16 0001      Neck Exercises: Supine   Upper Extremity D1 --  shoulder external rotation 3x 5 yellow band   Upper Extremity  Perturbation --  yellow band horiz abduction 3x5    Other Supine Exercise decompression series per HEP 8 reps    Other Supine Exercise UE Ranger 5x flexion 8x right and left     Shoulder Exercises: ROM/Strengthening   Other ROM/Strengthening Exercises Nu-Step seat 6 arms 7 lumbar roll 4 min     Cryotherapy   Number Minutes Cryotherapy 10 Minutes   Cryotherapy Location Cervical   Type of Cryotherapy Ice pack     Manual Therapy   Kinesiotex --  for cervical support  2 Y strips start upper neck downward                  PT Short Term Goals - 11/14/16 1359      PT SHORT TERM GOAL #1   Title The patient will demonstrate knowledge of postural correction and active rest to promote healing  11/19/16   Status Achieved     PT SHORT TERM GOAL #2   Title Cervical roation to 30 degrees, flexion and extension to 40 degrees and bilateral sidebending to  30 degrees needed for driving   Time 4   Period Weeks   Status On-going     PT SHORT TERM GOAL #3   Title The patient will have a 25% reduction in pain needed for return to work   Time 4   Period Weeks   Status On-going           PT Long Term Goals - 11/14/16 1359      PT LONG TERM GOAL #1   Title The patient will be independent in safe self progression of HEP needed for recovery of full ROM and strength   12/24/16   Time 8   Period Weeks   Status On-going     PT LONG TERM GOAL #2   Title The patient will have improved cervical rotation to 40 degrees needed for driving   Time 8   Period Weeks   Status On-going     PT LONG TERM GOAL #3   Title The patient will report a 60% improvement in pain with usual ADLs and work duties    Time 8   Period Weeks   Status On-going     PT LONG TERM GOAL #4   Title Grip strength improved to 25# bilaterally needed for gripping and lifting   Time 8   Period Weeks   Status On-going     PT LONG TERM GOAL #5   Title FOTO functional outcome score improved from 57% limitation to 34%  indicating improved function with less pain   Time 8   Period Weeks   Status On-going               Plan - 11/14/16 1355    Clinical Impression Statement The patient continues to be in moderate pain but has made improvements in cervical and shoulder ROM, grip strength and some functional improvements (able to drive short distances and worked a few hours this morning.).  She has not returned to her previous active lifestyle which includes weight lifting.  Recommend continued PT for functional mobility and reactivation and modalities as needed for pain control.   Therapist closely monitoring response with all interventions.     PT Next Visit Plan see how MD appt went;   low level mobility ex as tolerated;  taping as needed;  ice pack      Patient will benefit from skilled therapeutic intervention in order to improve the following deficits and impairments:     Visit Diagnosis: Cervicalgia  Muscle weakness (generalized)  Abnormal posture     Problem List Patient Active Problem List   Diagnosis Date Noted  . 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 INFLUENZA 12/13/2009  . GERD 04/24/2009  . 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 ALLERGIC RHINITIS 07/17/2007  . OVARIAN CYST, RUPTURED 07/17/2007   Ruben Im, PT 11/14/16 2:00 PM Phone: [phone] Fax: [phone]  Alvera Singh 11/14/2016, 2:00 PM  [facility] [address] [place], [place], [address] Phone: [phone]   Fax:  [phone]  Name: Susan Foster MRN: [id number] Date of Birth: 29-Sep-1971

## 2016-11-14 NOTE — Addendum Note (Signed)
Addended by: Douglass Rivers T on: 11/14/2016 04:45 PM   Modules accepted: Orders
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
## 2016-11-19 ENCOUNTER — Encounter: Payer: Self-pay | Admitting: Physical Therapy

## 2016-11-19 ENCOUNTER — Ambulatory Visit: Payer: Worker's Compensation | Admitting: Physical Therapy

## 2016-11-19 DIAGNOSIS — M542 Cervicalgia: Secondary | ICD-10-CM | POA: Diagnosis not present

## 2016-11-19 DIAGNOSIS — R293 Abnormal posture: Secondary | ICD-10-CM | POA: Diagnosis not present

## 2016-11-19 DIAGNOSIS — M6281 Muscle weakness (generalized): Secondary | ICD-10-CM
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
## 2016-11-20 ENCOUNTER — Telehealth: Payer: Self-pay | Admitting: Family Medicine

## 2016-11-20 NOTE — Telephone Encounter (Signed)
Appointment Request From: Radene Journey    With Provider: Lyndal Pulley, [facility]]    Preferred Date Range: Any date 11/20/2016 or later    Preferred Times: Any    Reason for visit: Office Visit    Comments:  Dr. Tamala Julian, last night I had a pain to hit in the back left of my head and neck. The pain was sharp and I felt me knees buckle but leaned into the door to not hit the floor. I felt nauseous and grabbed the back of my head until it eased. I was able to get home from the office and took four Ibuprofen. The area of the pain started a burning sensation. My first thought was the emergency room, but I wanted to wait it out and hopefully see you today. The pressure is on the left back side of my head. I laid down with ice and it eased up. The predisone injections only made me feel hyped, but the pressure in my head and neck has not improved. Last night I did not rest or take the Gabapentin. This morning I called your office before business hours and spoke with a nurse. She asked questions and suggested that if I felt the need to go to the ER, then I should go. At this point, I want your direction on what I should do. [place] Leis 9:20 a.m. [phone]

## 2016-11-20 NOTE — Telephone Encounter (Signed)
Can we call patient and try to see her next week or we can order MRI now.  Either would be fine.

## 2016-11-21 ENCOUNTER — Encounter: Payer: Self-pay | Admitting: Physical Therapy

## 2016-11-21 ENCOUNTER — Telehealth: Payer: Self-pay | Admitting: Family Medicine

## 2016-11-21 ENCOUNTER — Ambulatory Visit: Payer: Worker's Compensation | Admitting: Physical Therapy

## 2016-11-21 DIAGNOSIS — M542 Cervicalgia: Secondary | ICD-10-CM | POA: Diagnosis not present

## 2016-11-21 DIAGNOSIS — R293 Abnormal posture: Secondary | ICD-10-CM | POA: Diagnosis not present

## 2016-11-21 DIAGNOSIS — M6281 Muscle weakness (generalized): Secondary | ICD-10-CM | POA: Diagnosis not present
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
## 2016-11-22 ENCOUNTER — Other Ambulatory Visit: Payer: Self-pay | Admitting: *Deleted

## 2016-11-22 DIAGNOSIS — S134XXA Sprain of ligaments of cervical spine, initial encounter: Secondary | ICD-10-CM

## 2016-11-22 NOTE — Telephone Encounter (Signed)
Discussed with pt, mri entered.

## 2016-11-22 NOTE — Telephone Encounter (Signed)
lmovm for pt to return call.  

## 2016-11-25 ENCOUNTER — Ambulatory Visit (INDEPENDENT_AMBULATORY_CARE_PROVIDER_SITE_OTHER): Payer: Worker's Compensation | Admitting: Family Medicine

## 2016-11-25 ENCOUNTER — Encounter: Payer: Self-pay | Admitting: Family Medicine

## 2016-11-25 DIAGNOSIS — M5412 Radiculopathy, cervical region: Secondary | ICD-10-CM | POA: Insufficient documentation

## 2016-11-25 MED ORDER — VENLAFAXINE HCL ER 37.5 MG PO CP24: 37.5000 mg | ORAL_CAPSULE | Freq: Every day | ORAL | 1 refills | Status: DC

## 2016-11-25 NOTE — Patient Instructions (Addendum)
Good to see you  I think the MRI is a good idea. We will see what it shows  In the meantime lets also start the effexor at 37.5mg  daily  Turmeric 500mg  twice daily can help with inflammation.  Ice is your friend.  Lets see what your MRI shows and we will come up with a good plan.  See me again in 2 weeks and if everything looks good then we can consider manipulation or other treatment.
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
## 2016-11-25 NOTE — Assessment & Plan Note (Signed)
Worsening symptoms. Patient is having more cervical radicular the. I do feel that advance imaging is warranted at this time. Patient will have her MRI as scheduled for tomorrow. Depending on findings we will discuss different treatment options. We are ruling out any type of musculoskeletal component such as a muscle, tendon or ligament injury. We will monitor for also a nerve impingement with the radicular symptoms. Patient encouraged to take the gabapentin which she has not taken regularly. Reflexive that the dystrophy is within the differential. No significant improvement we will need to consider starting the Effexor which was prescribed today. Depending on once again the advance imaging this can change medical management.

## 2016-11-26 ENCOUNTER — Other Ambulatory Visit: Payer: Self-pay | Admitting: *Deleted

## 2016-11-26 ENCOUNTER — Ambulatory Visit
Admission: RE | Admit: 2016-11-26 | Discharge: 2016-11-26 | Disposition: A | Discharge status: Home / Self Care | Payer: Self-pay | Source: Ambulatory Visit | Attending: Family Medicine | Admitting: Family Medicine

## 2016-11-26 DIAGNOSIS — S134XXA Sprain of ligaments of cervical spine, initial encounter: Secondary | ICD-10-CM

## 2016-11-26 DIAGNOSIS — M50222 Other cervical disc displacement at C5-C6 level: Secondary | ICD-10-CM | POA: Diagnosis not present

## 2016-11-26 DIAGNOSIS — Q048 Other specified congenital malformations of brain: Secondary | ICD-10-CM

## 2016-11-27 ENCOUNTER — Encounter: Payer: Self-pay | Admitting: Family Medicine

## 2016-12-01 ENCOUNTER — Ambulatory Visit
Admission: RE | Admit: 2016-12-01 | Discharge: 2016-12-01 | Disposition: A | Discharge status: Home / Self Care | Payer: Self-pay | Source: Ambulatory Visit | Attending: Family Medicine | Admitting: Family Medicine

## 2016-12-01 DIAGNOSIS — Q048 Other specified congenital malformations of brain: Secondary | ICD-10-CM

## 2016-12-01 MED ORDER — GADOBENATE DIMEGLUMINE 529 MG/ML IV SOLN: 12.0000 mL | Freq: Once | INTRAVENOUS | Status: DC | PRN
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
## 2016-12-09 ENCOUNTER — Encounter: Payer: Self-pay | Admitting: Family Medicine

## 2016-12-09 ENCOUNTER — Ambulatory Visit (INDEPENDENT_AMBULATORY_CARE_PROVIDER_SITE_OTHER): Payer: Worker's Compensation | Admitting: Family Medicine

## 2016-12-09 DIAGNOSIS — S134XXA Sprain of ligaments of cervical spine, initial encounter: Secondary | ICD-10-CM | POA: Diagnosis not present

## 2016-12-09 DIAGNOSIS — Q048 Other specified congenital malformations of brain: Secondary | ICD-10-CM

## 2016-12-09 NOTE — Patient Instructions (Signed)
Good to see you Susan Foster 20 minutes 2 times daily. Usually after activity and before bed. Lets continue the Effexor.  I want you to get in the gym 3 times a week at 50%  I am glad we are doing better.  No free weights maybe until I see you  See me again in 2 weeks to make sure your doing well Chiari malformation type 1
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
## 2016-12-09 NOTE — Assessment & Plan Note (Signed)
We'll monitor closely. Do not think we need further evaluation at this time. Patient knows of significant worsening symptoms to seek medical attention immediately. Do not feel that this would be any intervention that would be beneficial at this time.
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
## 2016-12-23 ENCOUNTER — Ambulatory Visit: Payer: BLUE CROSS/BLUE SHIELD | Admitting: Neurology

## 2016-12-23 ENCOUNTER — Ambulatory Visit (INDEPENDENT_AMBULATORY_CARE_PROVIDER_SITE_OTHER): Payer: Worker's Compensation | Admitting: Family Medicine

## 2016-12-23 ENCOUNTER — Encounter: Payer: Self-pay | Admitting: Family Medicine

## 2016-12-23 ENCOUNTER — Ambulatory Visit: Payer: BLUE CROSS/BLUE SHIELD | Admitting: Family Medicine

## 2016-12-23 DIAGNOSIS — S134XXA Sprain of ligaments of cervical spine, initial encounter: Secondary | ICD-10-CM | POA: Diagnosis not present

## 2016-12-23 NOTE — Patient Instructions (Addendum)
Great to see you  I am so glad we are on the mend.  At this time lets take the reigns off and go to town.  I expect some discomfort but not pain.  Any pain take a step back.  I think the vitamin D should be continued probably indefinitely.  You will do well.  Increase activity little by little.  See me again if you need me.

## 2016-12-23 NOTE — Assessment & Plan Note (Signed)
Patient is doing well at this time. We discussed with patient that she is likely at maximal improvement in back to her baseline. Will take some time deferred increase activity. Patient will start titrating off the Effexor at this time. Patient will continue with once weekly vitamin D. Patient will start increasing activity as tolerated. Follow-up as needed.

## 2016-12-28 ENCOUNTER — Encounter: Payer: Self-pay | Admitting: Family Medicine

## 2017-02-03 ENCOUNTER — Telehealth: Payer: Self-pay | Admitting: Family Medicine
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
## 2017-02-10 ENCOUNTER — Ambulatory Visit (INDEPENDENT_AMBULATORY_CARE_PROVIDER_SITE_OTHER): Payer: Worker's Compensation | Admitting: Family Medicine

## 2017-02-10 ENCOUNTER — Encounter: Payer: Self-pay | Admitting: Family Medicine

## 2017-02-10 VITALS — BP 102/62 | HR 68 | Ht 63.5 in | Wt 130.0 lb

## 2017-02-10 DIAGNOSIS — M999 Biomechanical lesion, unspecified: Secondary | ICD-10-CM | POA: Diagnosis not present

## 2017-02-10 DIAGNOSIS — Q048 Other specified congenital malformations of brain: Secondary | ICD-10-CM | POA: Diagnosis not present

## 2017-02-10 DIAGNOSIS — M5412 Radiculopathy, cervical region: Secondary | ICD-10-CM

## 2017-02-10 MED ORDER — VENLAFAXINE HCL ER 37.5 MG PO CP24: 37.5000 mg | ORAL_CAPSULE | Freq: Every day | ORAL | 1 refills | Status: DC

## 2017-02-10 NOTE — Assessment & Plan Note (Signed)
Patient is having some recurrent cervical radiculopathy. Mild overall. Patient did respond fairly well to osteopathic manipulation today. We discussed posture and ergonomics. Patient given scapular exercises. Patient will restart on the Effexor when she was doing better. Discussed with patient if she would like somebody to evaluate for the cerebellar ectopia. F/u in 3-4 weeks.

## 2017-02-10 NOTE — Assessment & Plan Note (Addendum)
Decision today to treat with OMT was based on Physical Exam  After verbal consent patient was treated with ME, FPR techniques in cervical, thoracic, lumbar and sacral areas  Patient tolerated the procedure well with improvement in symptoms  Patient given exercises, stretches and lifestyle modifications  See medications in patient instructions if given  Patient will follow up in 2-4 weeks 

## 2017-02-10 NOTE — Patient Instructions (Signed)
Good to see you  Tried some manipulatin today  Ice is your friend.  On wall with heels, butt shoulder and head touching for a goal of 5 minutes daily  Stay active but keep hands within peripheral vision.  Exercises 3 times a week.  Effexor 37.5 mg daily  See me again in 3 weeks and send me a message on the one practice you are talking about.

## 2017-02-12 ENCOUNTER — Telehealth: Payer: Self-pay | Admitting: Family Medicine

## 2017-02-12 ENCOUNTER — Encounter: Payer: Self-pay | Admitting: Family Medicine

## 2017-02-12 DIAGNOSIS — Z01419 Encounter for gynecological examination (general) (routine) without abnormal findings: Secondary | ICD-10-CM | POA: Diagnosis not present

## 2017-02-12 DIAGNOSIS — Z113 Encounter for screening for infections with a predominantly sexual mode of transmission: Secondary | ICD-10-CM | POA: Diagnosis not present

## 2017-02-12 DIAGNOSIS — Z124 Encounter for screening for malignant neoplasm of cervix: Secondary | ICD-10-CM | POA: Diagnosis not present

## 2017-02-12 DIAGNOSIS — Z1231 Encounter for screening mammogram for malignant neoplasm of breast: Secondary | ICD-10-CM | POA: Diagnosis not present

## 2017-02-12 DIAGNOSIS — Z6823 Body mass index (BMI) 23.0-23.9, adult: Secondary | ICD-10-CM | POA: Diagnosis not present

## 2017-02-12 NOTE — Telephone Encounter (Signed)
Pt is seeing [facility] on 5/2 9am with Dr.Ditty

## 2017-02-12 NOTE — Telephone Encounter (Signed)
Noted  
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
## 2017-03-10 ENCOUNTER — Encounter: Payer: Self-pay | Admitting: Family Medicine

## 2017-03-10 ENCOUNTER — Ambulatory Visit (INDEPENDENT_AMBULATORY_CARE_PROVIDER_SITE_OTHER): Payer: Worker's Compensation | Admitting: Family Medicine

## 2017-03-10 DIAGNOSIS — Q048 Other specified congenital malformations of brain: Secondary | ICD-10-CM

## 2017-03-10 DIAGNOSIS — M5412 Radiculopathy, cervical region: Secondary | ICD-10-CM

## 2017-03-10 NOTE — Assessment & Plan Note (Signed)
Patient will be seen in neurosurgery for further evaluation. Likely with patient doing well she will willing to have conservative therapy. Worsening symptoms we'll consider other options.

## 2017-03-10 NOTE — Assessment & Plan Note (Signed)
Patient is having no radicular symptoms at this time. Seems to be stable. Does have the degenerative disc disease but very mild overall. Discussed with patient I think she is doing well with conservative therapy and just needs to avoid certain lifting. Felt like she did not need any osteopathic manipulation today. Follow-up with me again as needed.

## 2017-03-11 DIAGNOSIS — G935 Compression of brain: Secondary | ICD-10-CM | POA: Diagnosis not present

## 2017-03-11 DIAGNOSIS — M542 Cervicalgia: Secondary | ICD-10-CM | POA: Diagnosis not present

## 2017-03-11 DIAGNOSIS — R030 Elevated blood-pressure reading, without diagnosis of hypertension: Secondary | ICD-10-CM | POA: Diagnosis not present

## 2017-04-22 ENCOUNTER — Telehealth: Payer: Self-pay | Admitting: *Deleted

## 2017-04-22 MED ORDER — VENLAFAXINE HCL ER 37.5 MG PO CP24: 37.5000 mg | ORAL_CAPSULE | Freq: Every day | ORAL | 1 refills | Status: DC

## 2017-04-22 NOTE — Telephone Encounter (Signed)
Refill done.  

## 2017-04-22 NOTE — Telephone Encounter (Signed)
Pt left msg on triage requesting refill on her Venlafaxine. Pls advise...Johny Chess

## 2017-06-09 ENCOUNTER — Encounter: Payer: Self-pay | Admitting: Family Medicine

## 2017-06-09 ENCOUNTER — Ambulatory Visit (INDEPENDENT_AMBULATORY_CARE_PROVIDER_SITE_OTHER): Payer: BLUE CROSS/BLUE SHIELD | Admitting: Family Medicine

## 2017-06-09 VITALS — BP 118/72 | HR 75 | Temp 98.9°F | Ht 63.0 in | Wt 132.0 lb

## 2017-06-09 DIAGNOSIS — Z9109 Other allergy status, other than to drugs and biological substances: Secondary | ICD-10-CM

## 2017-06-09 MED ORDER — METHYLPREDNISOLONE ACETATE 80 MG/ML IJ SUSP
120.0000 mg | Freq: Once | INTRAMUSCULAR | Status: AC
  Administered 2017-06-09: 120 mg via INTRAMUSCULAR

## 2017-06-09 NOTE — Addendum Note (Signed)
Addended by: Aggie Hacker A on: 06/09/2017 05:12 PM   Modules accepted: Orders

## 2017-06-09 NOTE — Progress Notes (Signed)
   Subjective:    Patient ID: Susan Foster, female    DOB: December 25, 1970, 46 y.o.   MRN: [id number]  HPI Here for what she thinks is an allergic reaction. Last weekend she helped a friend move out of her apartment and her friend has several pet birds. After she Susan Foster left the apartment and went home she developed some stuffiness in the head, ear pressure, runny nose, sneezing, PND, and a dry cough. No fever. She is blowing clear mucus from the nose. She has tried Human resources officer with partial results.    Review of Systems  Constitutional: Negative.   HENT: Positive for congestion, postnasal drip, sinus pressure and sore throat. Negative for sinus pain.   Eyes: Negative.   Respiratory: Positive for cough. Negative for chest tightness, shortness of breath and wheezing.   Neurological: Negative.        Objective:   Physical Exam  Constitutional: She is oriented to person, place, and time. She appears well-developed and well-nourished.  HENT:  Right Ear: External ear normal.  Left Ear: External ear normal.  Nose: Nose normal.  Mouth/Throat: Oropharynx is clear and moist.  Eyes: Conjunctivae are normal.  Neck: No thyromegaly present.  Cardiovascular: Normal rate, regular rhythm, normal heart sounds and intact distal pulses.   Pulmonary/Chest: Effort normal and breath sounds normal. No respiratory distress. She has no wheezes. She has no rales.  Lymphadenopathy:    She has no cervical adenopathy.  Neurological: She is alert and oriented to person, place, and time.          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 Try Xyzal daily.  Alysia Penna, MD

## 2017-06-09 NOTE — Patient Instructions (Signed)
WE NOW OFFER   [facility] FAST TRACK!!!  SAME DAY Appointments for ACUTE CARE  Such as: Sprains, Injuries, cuts, abrasions, rashes, muscle pain, joint pain, back pain Colds, flu, sore throats, headache, allergies, cough, fever  Ear pain, sinus and eye infections Abdominal pain, nausea, vomiting, diarrhea, upset stomach Animal/insect bites  3 Easy Ways to Schedule: Walk-In Scheduling Call in scheduling Mychart Sign-up: [email or web address]         
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
## 2017-07-04 ENCOUNTER — Encounter: Payer: Self-pay | Admitting: Family Medicine

## 2017-07-04 ENCOUNTER — Ambulatory Visit (INDEPENDENT_AMBULATORY_CARE_PROVIDER_SITE_OTHER): Payer: BLUE CROSS/BLUE SHIELD | Admitting: Family Medicine

## 2017-07-04 DIAGNOSIS — Q048 Other specified congenital malformations of brain: Secondary | ICD-10-CM | POA: Diagnosis not present

## 2017-07-04 DIAGNOSIS — M5412 Radiculopathy, cervical region: Secondary | ICD-10-CM

## 2017-07-04 NOTE — Patient Instructions (Signed)
Great to see you  I think you will do well off the medicine.  Effexor now Monday, wed, Friday for next 2 weeks If worsening pain then consider gabapentin 100mg  up to 2 times a day  Send a message in 2 weeks and see me again in 4 weeks if not perfect

## 2017-07-04 NOTE — Assessment & Plan Note (Signed)
Stable. Patient did see neurosurgery to discuss that she has not a surgical candidate

## 2017-07-04 NOTE — Assessment & Plan Note (Signed)
She does have more of a cervical radiculopathy but seems to be well overall. Patient once again, the Effexor. Discussed with patient at great length. Patient will start to decrease any given an protocol and patient instructions today. Worsening symptoms can take the gabapentin the patient has had previously. Patient follow-up with me again in 4 weeks if not completely resolved. Based on patient's of presentation I do feel that patient has made maximal medical improvement in patient has a 0% disability rating

## 2017-07-17 ENCOUNTER — Encounter: Payer: Self-pay | Admitting: Family Medicine

## 2017-07-23 ENCOUNTER — Telehealth: Payer: Self-pay | Admitting: Family Medicine

## 2017-07-23 NOTE — Telephone Encounter (Signed)
Pt called in and said that she is trying to come off the Effexor but she is having pressure in her neck and head again    Best number [phone]

## 2017-07-23 NOTE — Telephone Encounter (Signed)
Pt has decided she will continue the effexor for now.

## 2017-07-23 NOTE — Telephone Encounter (Signed)
We can either consider continue the effexor indefinitely for now.  Or we could bridge with gabapentin or nortriptyline to help a little.

## 2017-07-31 ENCOUNTER — Encounter: Payer: Self-pay | Admitting: Family Medicine

## 2017-08-06 ENCOUNTER — Encounter: Payer: Self-pay | Admitting: Family Medicine

## 2017-08-06 ENCOUNTER — Ambulatory Visit (INDEPENDENT_AMBULATORY_CARE_PROVIDER_SITE_OTHER): Payer: Worker's Compensation | Admitting: Family Medicine

## 2017-08-06 DIAGNOSIS — G9050 Complex regional pain syndrome I, unspecified: Secondary | ICD-10-CM | POA: Diagnosis not present

## 2017-08-06 MED ORDER — VENLAFAXINE HCL ER 37.5 MG PO CP24: 37.5000 mg | ORAL_CAPSULE | Freq: Every day | ORAL | 1 refills | Status: DC
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
## 2017-08-06 NOTE — Patient Instructions (Signed)
Good to see you  Restart effexor 37.5mg  daily for 90 days.  Stay active.  Stay hydrated  See me again in 6 weeks.
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
## 2017-09-17 ENCOUNTER — Ambulatory Visit: Payer: Self-pay | Admitting: Family Medicine
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
## 2017-09-19 ENCOUNTER — Ambulatory Visit: Payer: BLUE CROSS/BLUE SHIELD | Admitting: Family Medicine

## 2017-09-19 ENCOUNTER — Encounter: Payer: Self-pay | Admitting: Family Medicine

## 2017-09-19 DIAGNOSIS — G9050 Complex regional pain syndrome I, unspecified: Secondary | ICD-10-CM | POA: Diagnosis not present
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
## 2017-09-19 NOTE — Patient Instructions (Signed)
Good to see you  I think you are doing great  Do  What you want, I think you will do well  Continue the effexor See me again in 6 weeks  Happy holidays!

## 2017-10-15 DIAGNOSIS — N764 Abscess of vulva: Secondary | ICD-10-CM | POA: Diagnosis not present

## 2017-10-15 DIAGNOSIS — F321 Major depressive disorder, single episode, moderate: Secondary | ICD-10-CM | POA: Diagnosis not present

## 2017-10-15 DIAGNOSIS — Z6823 Body mass index (BMI) 23.0-23.9, adult: Secondary | ICD-10-CM | POA: Diagnosis not present

## 2017-10-15 NOTE — Telephone Encounter (Signed)
ERROR

## 2017-10-27 ENCOUNTER — Ambulatory Visit: Payer: BLUE CROSS/BLUE SHIELD | Admitting: Family Medicine

## 2017-10-27 ENCOUNTER — Encounter: Payer: Self-pay | Admitting: Family Medicine

## 2017-10-27 VITALS — BP 112/68 | HR 87 | Temp 98.2°F | Wt 132.6 lb

## 2017-10-27 DIAGNOSIS — D171 Benign lipomatous neoplasm of skin and subcutaneous tissue of trunk: Secondary | ICD-10-CM | POA: Diagnosis not present

## 2017-10-28 ENCOUNTER — Encounter: Payer: Self-pay | Admitting: Family Medicine

## 2017-10-28 NOTE — Progress Notes (Signed)
   Subjective:    Patient ID: Susan Foster, female    DOB: 11/13/1970, 46 y.o.   MRN: [id number]  HPI Here for a lump on the right buttock  that she noticed 3 days ago while in the shower. It is not painful but she has some pain down the right leg at times.    Review of Systems  Constitutional: Negative.   Respiratory: Negative.   Cardiovascular: Negative.        Objective:   Physical Exam  Constitutional: She appears well-developed and well-nourished.  Cardiovascular: Normal rate, regular rhythm, normal heart sounds and intact distal pulses.  Pulmonary/Chest: Effort normal and breath sounds normal. No respiratory distress. She has no wheezes. She has no rales.  Musculoskeletal:  There is a mobile firm non-tender lump under the skin over the superior right buttock.            Assessment & Plan:  This is a lipoma., and no treatment is required. I advised her that she could have it removed if she wished, but we agreed to observe it only for now.  Alysia Penna, MD

## 2017-11-07 ENCOUNTER — Encounter: Payer: Self-pay | Admitting: Family Medicine

## 2017-11-07 ENCOUNTER — Ambulatory Visit (INDEPENDENT_AMBULATORY_CARE_PROVIDER_SITE_OTHER): Payer: Worker's Compensation | Admitting: Family Medicine

## 2017-11-07 ENCOUNTER — Ambulatory Visit: Payer: Self-pay

## 2017-11-07 ENCOUNTER — Telehealth: Payer: Self-pay | Admitting: Family Medicine

## 2017-11-07 VITALS — BP 100/62 | HR 82 | Ht 63.0 in | Wt 137.0 lb

## 2017-11-07 DIAGNOSIS — G8929 Other chronic pain: Secondary | ICD-10-CM | POA: Diagnosis not present

## 2017-11-07 DIAGNOSIS — G9050 Complex regional pain syndrome I, unspecified: Secondary | ICD-10-CM | POA: Diagnosis not present

## 2017-11-07 DIAGNOSIS — D171 Benign lipomatous neoplasm of skin and subcutaneous tissue of trunk: Secondary | ICD-10-CM

## 2017-11-07 DIAGNOSIS — M25512 Pain in left shoulder: Secondary | ICD-10-CM

## 2017-11-07 MED ORDER — VENLAFAXINE HCL ER 37.5 MG PO CP24: 37.5000 mg | ORAL_CAPSULE | Freq: Every day | ORAL | 3 refills | Status: DC

## 2017-11-07 NOTE — Telephone Encounter (Signed)
Copied from [place] [id number]. Topic: Referral - Request >> Nov 07, 2017  9:52 AM Lennox Solders wrote: Reason for CRM: pt would a referral to general surgery for lipoma of torso

## 2017-11-07 NOTE — Progress Notes (Signed)
[facility] [address] [place], [place] [address] Phone: [phone] Subjective:    I'm seeing this patient by the request  of:    CC: Headache follow-up  [id number]  Susan Foster is a 46 y.o. female coming in with complaint of headache and neck pain.  Patient has been seen previously.  Patient has what appears to be more of a reflex sympathetic dystrophy.  Previous notes reviewed.  Patient started on the Effexor regularly again and is doing much better.  States that the headaches are very minimal.  States that the pain is improved as well.  Not having as much sensation going down the arm anymore.  Patient is happy with the results at this time.  We will start increasing activity but is found it difficult because she is been so busy at work.     Past Medical History:  Diagnosis Date  . Allergy   . GERD (gastroesophageal reflux disease)    Past Surgical History:  Procedure Laterality Date  . bicep surgery Right   . fibroid turmor     removed left breast   Social History   Socioeconomic History  . Marital status: Single    Spouse name: None  . Number of children: None  . Years of education: None  . Highest education level: None  Social Needs  . Financial resource strain: None  . Food insecurity - worry: None  . Food insecurity - inability: None  . Transportation needs - medical: None  . Transportation needs - non-medical: None  Occupational History  . Occupation: Risk manager  Tobacco Use  . Smoking status: Never Smoker  . Smokeless tobacco: Never Used  Substance and Sexual Activity  . Alcohol use: No    Alcohol/week: 0.0 oz  . Drug use: No  . Sexual activity: None  Other Topics Concern  . None  Social History Narrative   Lives alone.  No children.  Works as a Secondary school teacher.  Education: college.   Allergies  Allergen Reactions  . Augmentin [Amoxicillin-Pot Clavulanate]     hives  . Metronidazole Hives  .  Amoxicillin Rash   No family history on file.   Past medical history, social, surgical and family history all reviewed in electronic medical record.  No pertanent information unless stated regarding to the chief complaint.   Review of Systems:Review of systems updated and as accurate as of 11/07/17  No , visual changes, nausea, vomiting, diarrhea, constipation, dizziness, abdominal pain, skin rash, fevers, chills, night sweats, weight loss, swollen lymph nodes, body aches, joint swelling,  chest pain, shortness of breath, mood changes.  Positive muscle aches and headaches  Objective  Blood pressure 100/62, pulse 82, height 5\' 3"  (1.6 m), weight 137 lb (62.1 kg), last menstrual period 10/14/2017, SpO2 98 %. Systems examined below as of 11/07/17   General: No apparent distress alert and oriented x3 mood and affect normal, dressed appropriately.  HEENT: Pupils equal, extraocular movements intact  Respiratory: Patient's speak in full sentences and does not appear short of breath  Cardiovascular: No lower extremity edema, non tender, no erythema  Skin: Warm dry intact with no signs of infection or rash on extremities or on axial skeleton.  Abdomen: Soft nontender  Neuro: Cranial nerves II through XII are intact, neurovascularly intact in all extremities with 2+ DTRs and 2+ pulses.  Lymph: No lymphadenopathy of posterior or anterior cervical chain or axillae bilaterally.  Gait normal with good balance and coordination.  MSK:  Non tender with full range of motion and good stability and symmetric strength and tone of shoulders, elbows, wrist, hip, knee and ankles bilaterally.      Impression and Recommendations:     This case required medical decision making of moderate complexity.      Note: This dictation was prepared with Dragon dictation along with smaller phrase technology. Any transcriptional errors that result from this process are unintentional.

## 2017-11-07 NOTE — Patient Instructions (Addendum)
I think you e doing well  Continue the effexor daily for another 2 months Get back in the gym  Try the exercises 3 times a week  After working out.  See me again in 8 weeks

## 2017-11-07 NOTE — Telephone Encounter (Signed)
Called pt and left a VM that PCP is out of the office and will be back on Monday. Sent to PCP to place referral.

## 2017-11-07 NOTE — Assessment & Plan Note (Signed)
Doing better at this time.  Continue low dose of the Effexor for another 2 months.  We discussed icing regimen and home exercise.  We discussed potentially getting back into the gym.  Given some small recommendations.  Patient is going to try to make these changes and follow-up with me again 2 months.

## 2017-11-10 NOTE — Telephone Encounter (Signed)
Called pt and left a VM that referral has been placed and she should be getting a call in about a week or so for when her apt date will be.

## 2017-11-10 NOTE — Telephone Encounter (Signed)
The referral was done  

## 2017-11-21 ENCOUNTER — Encounter (HOSPITAL_COMMUNITY): Payer: Self-pay | Admitting: *Deleted

## 2017-11-21 ENCOUNTER — Emergency Department (HOSPITAL_COMMUNITY): Payer: Worker's Compensation

## 2017-11-21 ENCOUNTER — Emergency Department (HOSPITAL_COMMUNITY)
Admission: EM | Admit: 2017-11-21 | Discharge: 2017-11-21 | Disposition: A | Discharge status: Home / Self Care | Payer: Worker's Compensation | Attending: Emergency Medicine | Admitting: Emergency Medicine

## 2017-11-21 ENCOUNTER — Ambulatory Visit: Payer: Self-pay

## 2017-11-21 ENCOUNTER — Other Ambulatory Visit: Payer: Self-pay

## 2017-11-21 DIAGNOSIS — R200 Anesthesia of skin: Secondary | ICD-10-CM | POA: Insufficient documentation

## 2017-11-21 DIAGNOSIS — R51 Headache: Secondary | ICD-10-CM | POA: Insufficient documentation

## 2017-11-21 DIAGNOSIS — R42 Dizziness and giddiness: Secondary | ICD-10-CM | POA: Diagnosis not present

## 2017-11-21 DIAGNOSIS — R5383 Other fatigue: Secondary | ICD-10-CM | POA: Diagnosis not present

## 2017-11-21 DIAGNOSIS — J45909 Unspecified asthma, uncomplicated: Secondary | ICD-10-CM | POA: Insufficient documentation

## 2017-11-21 DIAGNOSIS — M5412 Radiculopathy, cervical region: Secondary | ICD-10-CM | POA: Diagnosis not present

## 2017-11-21 DIAGNOSIS — G8929 Other chronic pain: Secondary | ICD-10-CM

## 2017-11-21 DIAGNOSIS — H538 Other visual disturbances: Secondary | ICD-10-CM | POA: Diagnosis not present

## 2017-11-21 DIAGNOSIS — Z79899 Other long term (current) drug therapy: Secondary | ICD-10-CM | POA: Insufficient documentation

## 2017-11-21 DIAGNOSIS — R519 Headache, unspecified: Secondary | ICD-10-CM

## 2017-11-21 DIAGNOSIS — G43109 Migraine with aura, not intractable, without status migrainosus: Secondary | ICD-10-CM | POA: Diagnosis not present

## 2017-11-21 LAB — BASIC METABOLIC PANEL
Anion gap: 9 (ref 5–15)
BUN: 17 mg/dL (ref 6–20)
CALCIUM: 9.2 mg/dL (ref 8.9–10.3)
CO2: 24 mmol/L (ref 22–32)
CREATININE: 0.77 mg/dL (ref 0.44–1.00)
Chloride: 102 mmol/L (ref 101–111)
GFR calc non Af Amer: >60 mL/min (ref >60)
Glucose, Bld: 90 mg/dL (ref 65–99)
Potassium: 3.6 mmol/L (ref 3.5–5.1)
Sodium: 135 mmol/L (ref 135–145)

## 2017-11-21 LAB — CBC
HCT: 41 % (ref 36.0–46.0)
Hemoglobin: 13.8 g/dL (ref 12.0–15.0)
MCH: 30.3 pg (ref 26.0–34.0)
MCHC: 33.7 g/dL (ref 30.0–36.0)
MCV: 89.9 fL (ref 78.0–100.0)
PLATELETS: 266 10*3/uL (ref 150–400)
RBC: 4.56 MIL/uL (ref 3.87–5.11)
RDW: 12.5 % (ref 11.5–15.5)
WBC: 6.4 10*3/uL (ref 4.0–10.5)

## 2017-11-21 LAB — I-STAT TROPONIN, ED: Troponin i, poc: 0 ng/mL (ref 0.00–0.08)

## 2017-11-21 LAB — I-STAT BETA HCG BLOOD, ED (MC, WL, AP ONLY): I-stat hCG, quantitative: <5 m[IU]/mL (ref <5)

## 2017-11-21 MED ORDER — METOCLOPRAMIDE HCL 5 MG/ML IJ SOLN: 10.0000 mg | Freq: Once | INTRAMUSCULAR | Status: DC

## 2017-11-21 MED ORDER — KETOROLAC TROMETHAMINE 30 MG/ML IJ SOLN: 30.0000 mg | Freq: Once | INTRAMUSCULAR | Status: DC

## 2017-11-21 MED ORDER — SODIUM CHLORIDE 0.9 % IV BOLUS (SEPSIS): 1000.0000 mL | Freq: Once | INTRAVENOUS | Status: DC

## 2017-11-21 MED ORDER — METHYLPREDNISOLONE SODIUM SUCC 125 MG IJ SOLR: 125.0000 mg | Freq: Once | INTRAMUSCULAR | Status: DC

## 2017-11-21 NOTE — ED Notes (Signed)
Patient transported to X-ray 

## 2017-11-21 NOTE — ED Notes (Signed)
Pt denies need for premedication for claustrophobia for MRI. Tim in MRI contacted regarding when patient may be able to come over, Tim advised they should be able to send for patient in 15-20 mins.

## 2017-11-21 NOTE — ED Notes (Signed)
ED Provider at bedside. 

## 2017-11-21 NOTE — Telephone Encounter (Signed)
Confirmed patient has arrived in the ER now. 
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
## 2017-11-26 ENCOUNTER — Telehealth: Payer: Self-pay | Admitting: Family Medicine

## 2017-11-26 NOTE — Telephone Encounter (Signed)
Up to her.  No preference.

## 2017-11-26 NOTE — Telephone Encounter (Signed)
Copied from [facility] [phone]. Topic: General - Other >> Nov 26, 2017  8:46 AM Lolita Rieger, RMA wrote: Reason for CRM: Pt would like a call back to see if she needs to come in to see Dr. Tamala Julian or if she should make an appointment with neurology [phone] is the best number to reach her at

## 2017-11-27 NOTE — Telephone Encounter (Signed)
Discussed with pt

## 2017-12-01 DIAGNOSIS — D171 Benign lipomatous neoplasm of skin and subcutaneous tissue of trunk: Secondary | ICD-10-CM | POA: Diagnosis not present

## 2017-12-03 DIAGNOSIS — D179 Benign lipomatous neoplasm, unspecified: Secondary | ICD-10-CM | POA: Diagnosis not present

## 2017-12-03 DIAGNOSIS — D225 Melanocytic nevi of trunk: Secondary | ICD-10-CM | POA: Diagnosis not present

## 2017-12-03 DIAGNOSIS — L814 Other melanin hyperpigmentation: Secondary | ICD-10-CM | POA: Diagnosis not present

## 2017-12-03 DIAGNOSIS — D1801 Hemangioma of skin and subcutaneous tissue: Secondary | ICD-10-CM | POA: Diagnosis not present
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
## 2017-12-31 ENCOUNTER — Ambulatory Visit (INDEPENDENT_AMBULATORY_CARE_PROVIDER_SITE_OTHER): Payer: Worker's Compensation | Admitting: Family Medicine

## 2017-12-31 ENCOUNTER — Encounter: Payer: Self-pay | Admitting: Family Medicine

## 2017-12-31 DIAGNOSIS — G9050 Complex regional pain syndrome I, unspecified: Secondary | ICD-10-CM

## 2017-12-31 NOTE — Patient Instructions (Addendum)
Good to see you  I am sorry I do not have a great answer.  I think it may have been a complex migraine and I hope you never have a nother one.  My only other idea would be other labs but likely also not the cause Lets not change the effexor yet Send a message in 2 weeks to tell me how you are doing.  Make an appointment again in 2 months just in case but may be able to play with medicines over my chart

## 2017-12-31 NOTE — Assessment & Plan Note (Signed)
Spent  25 minutes with patient face-to-face and had greater than 50% of counseling including as described in assessment and plan.  Discussed with patient we do not see anything such as vascular or neurologic that should be contributing to this anymore.  Still seems to be some radicular symptoms from time to time.  Patient's episode that did cause her to be in the emergency department seem to be more like a complex migraine.  We discussed referral to our headache specialist which she declined at this moment.  We discussed other possibilities including stress related.  Patient wants to continue on the Effexor but does not want to go up on the dosing.  Hormonal changes that could be contributing to the more intense and specific headaches.  Patient feels like she has been fairly well controlled with her medications for 4 years.  We discussed the over-the-counter medications at this time and we discussed the possibility of laboratory workup which patient declined.  Patient wants to continue with conservative therapy at this point and follow-up again in 2 months

## 2018-01-02 ENCOUNTER — Ambulatory Visit: Payer: Self-pay | Admitting: Family Medicine

## 2018-01-17 DIAGNOSIS — Z23 Encounter for immunization: Secondary | ICD-10-CM | POA: Diagnosis not present

## 2018-01-17 DIAGNOSIS — S61329A Laceration with foreign body of unspecified finger with damage to nail, initial encounter: Secondary | ICD-10-CM | POA: Diagnosis not present

## 2018-01-20 DIAGNOSIS — D171 Benign lipomatous neoplasm of skin and subcutaneous tissue of trunk: Secondary | ICD-10-CM | POA: Diagnosis not present

## 2018-01-22 IMAGING — CR DG CHEST 2V
2 series · 2 of 2 positions shown · non-contrast
Comparison: None.

CLINICAL DATA: Headache and dizziness.

EXAM:
CHEST  2 VIEW

[chest pa]
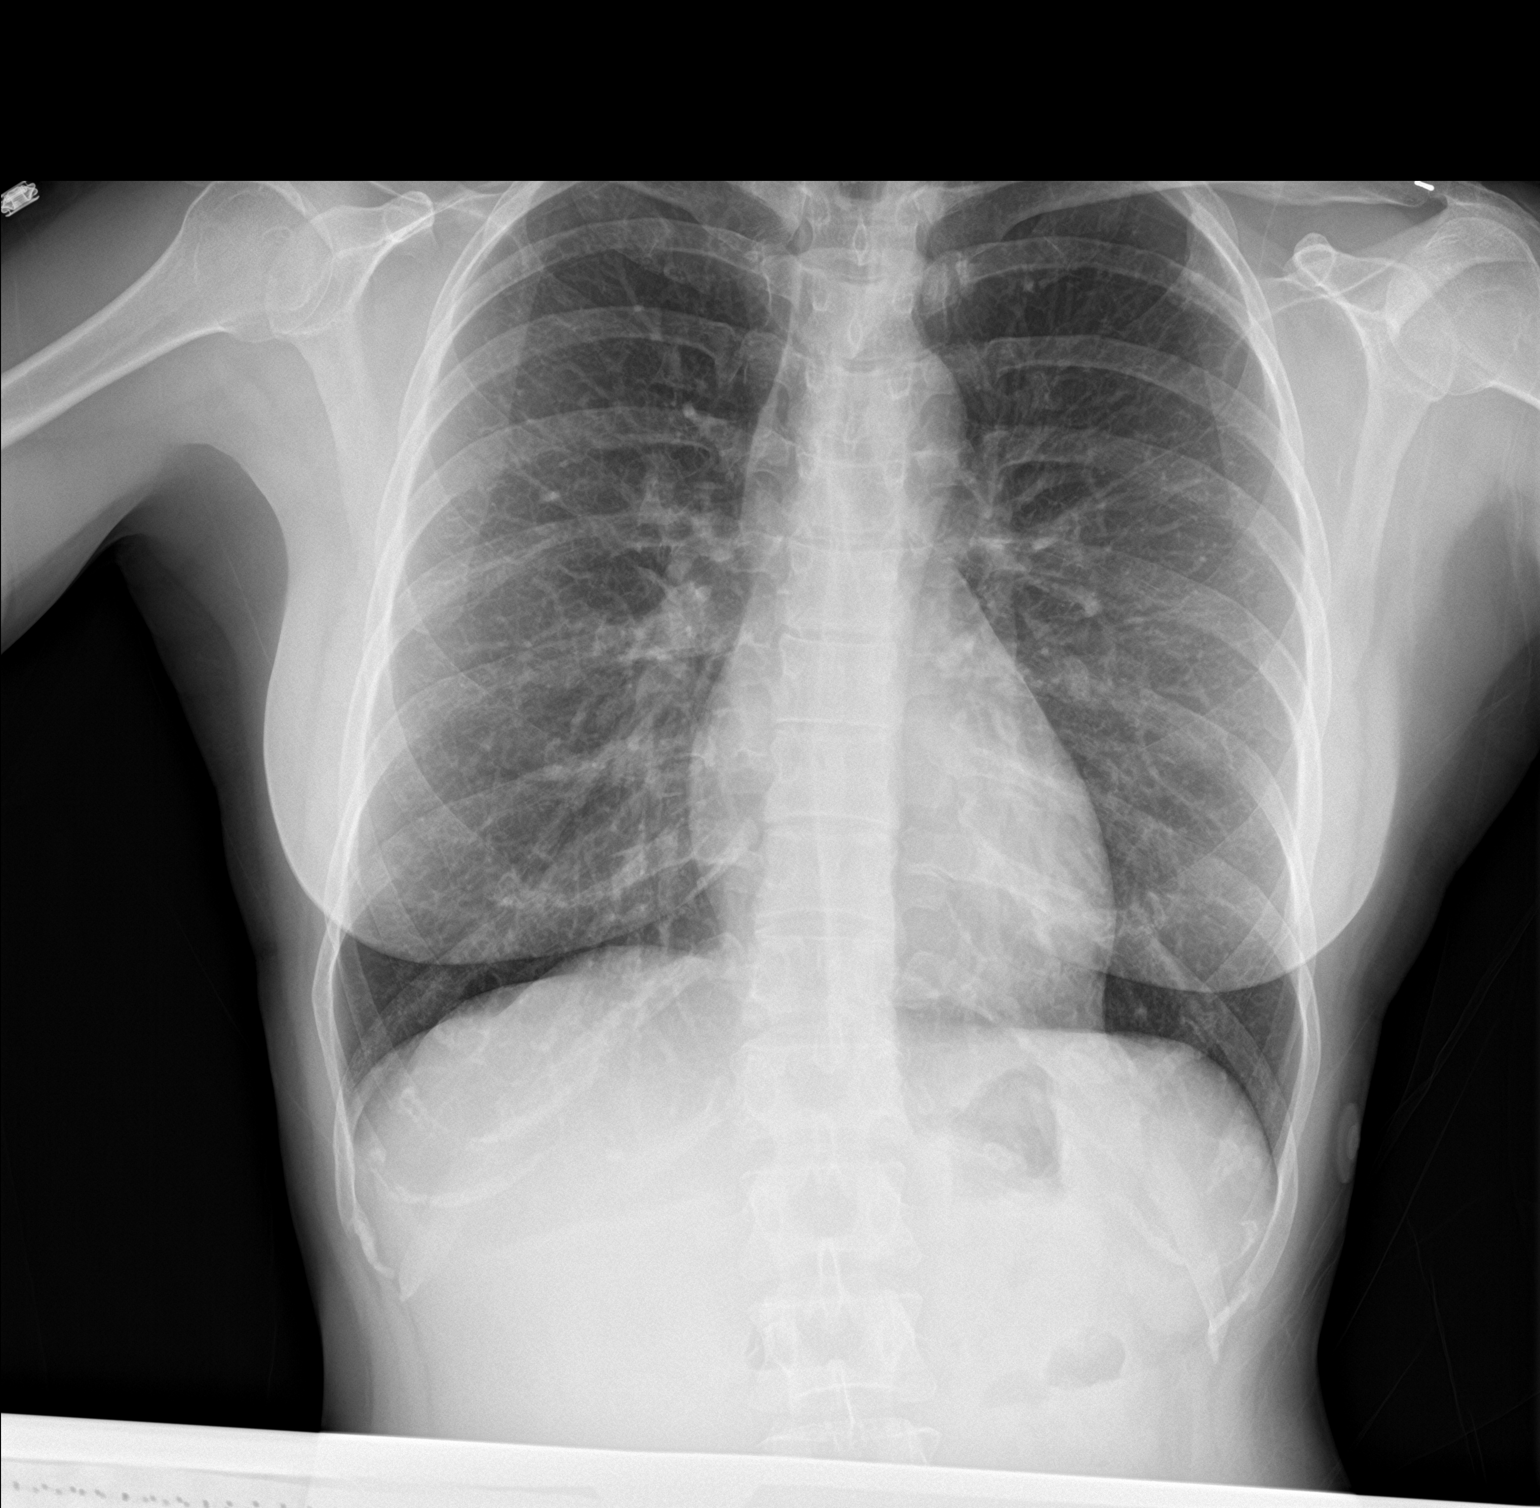

[chest lat]
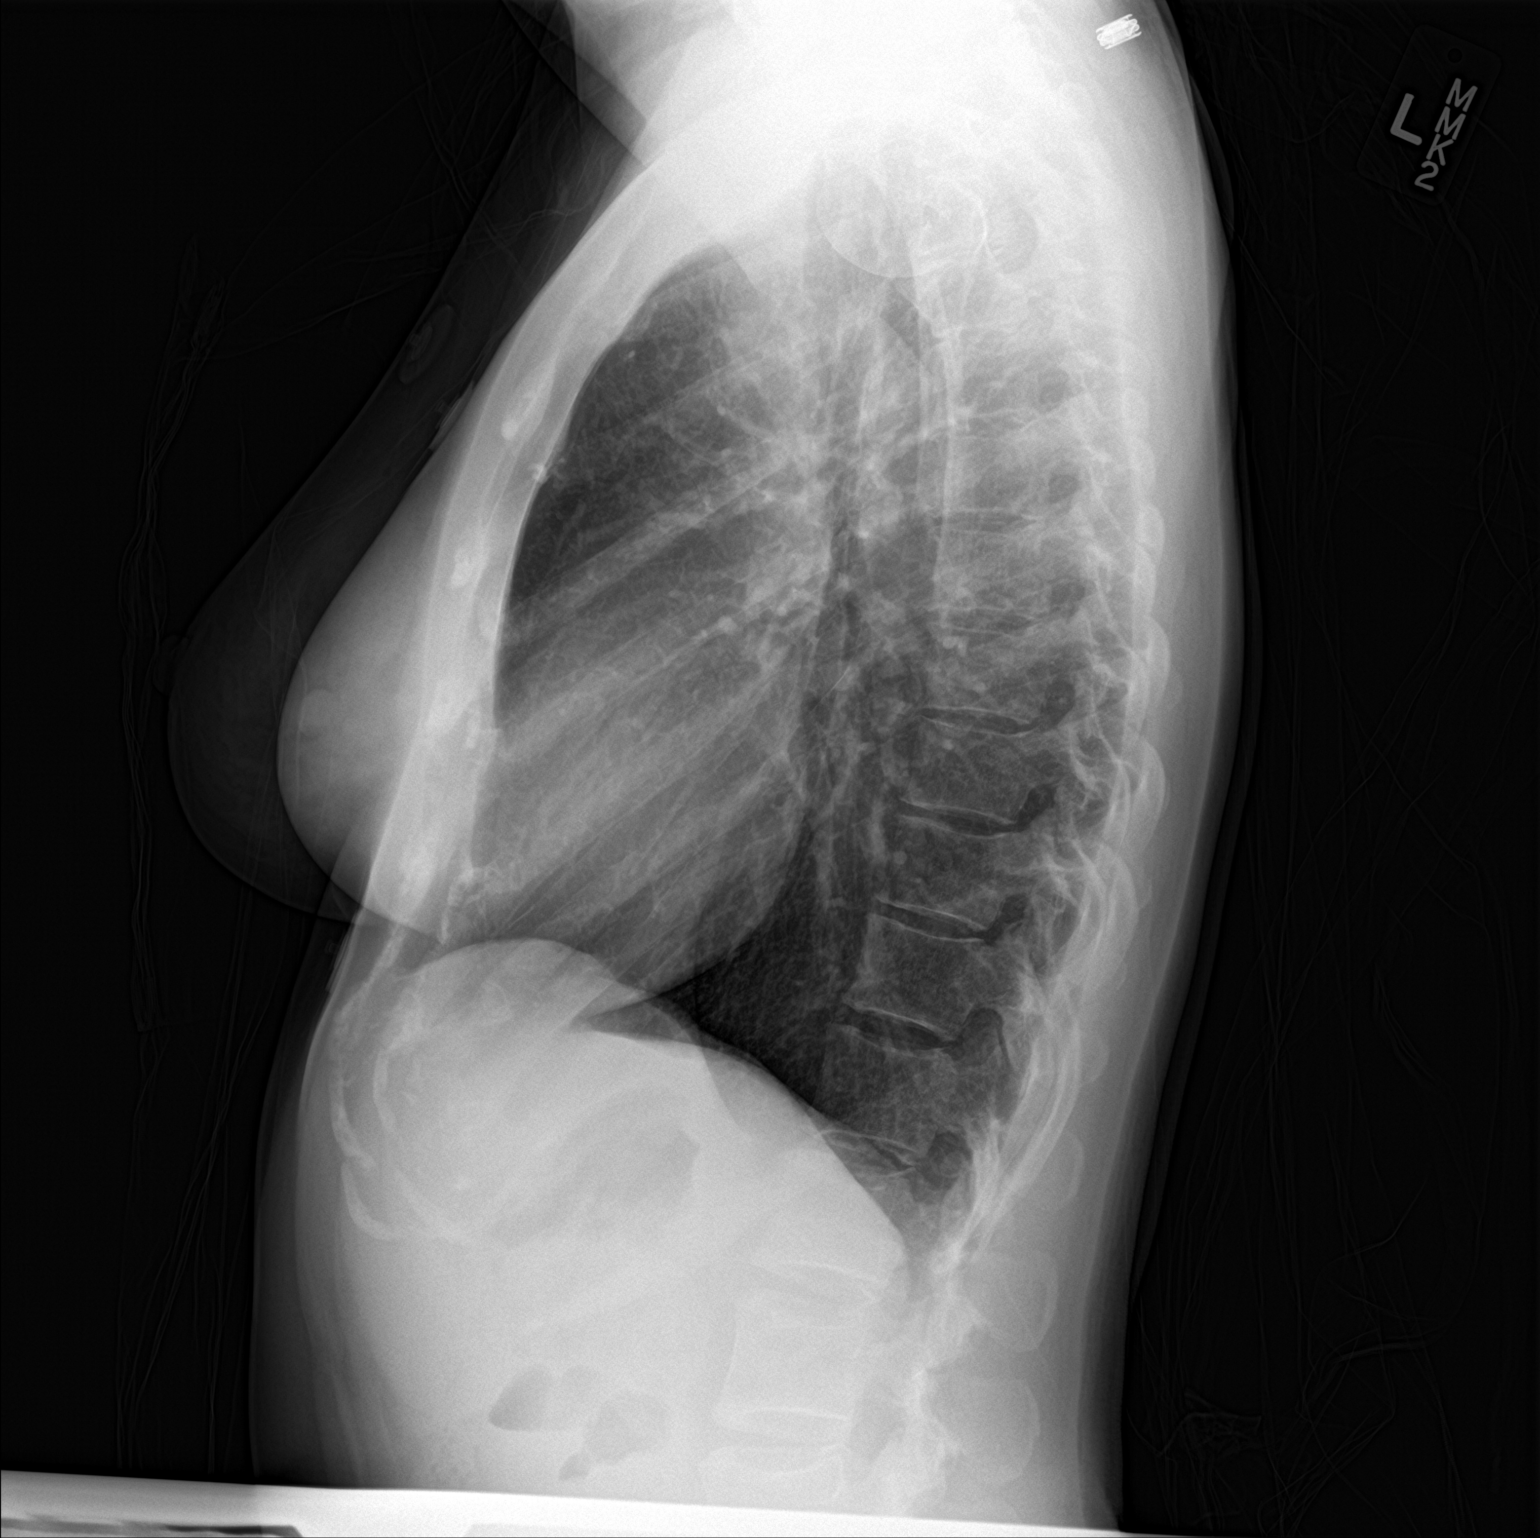

[2 of 2 positions shown; findings below may reference images not displayed]

FINDINGS: The heart size and mediastinal contours are within normal limits.
Both lungs are clear. The visualized skeletal structures are
unremarkable.
IMPRESSION: No active cardiopulmonary disease.

## 2018-02-04 ENCOUNTER — Encounter (HOSPITAL_BASED_OUTPATIENT_CLINIC_OR_DEPARTMENT_OTHER): Payer: Self-pay

## 2018-02-04 ENCOUNTER — Other Ambulatory Visit: Payer: Self-pay

## 2018-02-04 NOTE — Progress Notes (Signed)
Spoke with:  Mardene Celeste  NPO:  After Midnight, no gum, candy, or mints   Arrival time: [id number] Labs: Urine Preg AM medications: None Pre op orders: No Ride home:  Chloe Miyoshi (boyfriend) [phone]

## 2018-02-06 ENCOUNTER — Ambulatory Visit: Payer: Self-pay | Admitting: Plastic Surgery

## 2018-02-06 DIAGNOSIS — D171 Benign lipomatous neoplasm of skin and subcutaneous tissue of trunk: Secondary | ICD-10-CM

## 2018-02-11 ENCOUNTER — Encounter (HOSPITAL_BASED_OUTPATIENT_CLINIC_OR_DEPARTMENT_OTHER): Payer: Self-pay | Admitting: Anesthesiology
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
## 2018-02-12 ENCOUNTER — Encounter (HOSPITAL_BASED_OUTPATIENT_CLINIC_OR_DEPARTMENT_OTHER)
Admission: RE | Disposition: A | Discharge status: Home / Self Care | Payer: Self-pay | Source: Ambulatory Visit | Attending: Plastic Surgery

## 2018-02-12 ENCOUNTER — Encounter (HOSPITAL_BASED_OUTPATIENT_CLINIC_OR_DEPARTMENT_OTHER): Payer: Self-pay | Admitting: Certified Registered"

## 2018-02-12 ENCOUNTER — Ambulatory Visit (HOSPITAL_BASED_OUTPATIENT_CLINIC_OR_DEPARTMENT_OTHER)
Admission: RE | Admit: 2018-02-12 | Discharge: 2018-02-12 | Disposition: A | Discharge status: Home / Self Care | Payer: BLUE CROSS/BLUE SHIELD | Source: Ambulatory Visit | Attending: Plastic Surgery | Admitting: Plastic Surgery

## 2018-02-12 ENCOUNTER — Ambulatory Visit (HOSPITAL_BASED_OUTPATIENT_CLINIC_OR_DEPARTMENT_OTHER): Payer: BLUE CROSS/BLUE SHIELD | Admitting: Anesthesiology

## 2018-02-12 DIAGNOSIS — J309 Allergic rhinitis, unspecified: Secondary | ICD-10-CM | POA: Diagnosis not present

## 2018-02-12 DIAGNOSIS — G9050 Complex regional pain syndrome I, unspecified: Secondary | ICD-10-CM | POA: Diagnosis not present

## 2018-02-12 DIAGNOSIS — K219 Gastro-esophageal reflux disease without esophagitis: Secondary | ICD-10-CM | POA: Diagnosis not present

## 2018-02-12 DIAGNOSIS — G43909 Migraine, unspecified, not intractable, without status migrainosus: Secondary | ICD-10-CM | POA: Diagnosis not present

## 2018-02-12 DIAGNOSIS — J45909 Unspecified asthma, uncomplicated: Secondary | ICD-10-CM | POA: Diagnosis not present

## 2018-02-12 DIAGNOSIS — D171 Benign lipomatous neoplasm of skin and subcutaneous tissue of trunk: Secondary | ICD-10-CM | POA: Insufficient documentation

## 2018-02-12 DIAGNOSIS — Z91040 Latex allergy status: Secondary | ICD-10-CM | POA: Insufficient documentation

## 2018-02-12 DIAGNOSIS — Z888 Allergy status to other drugs, medicaments and biological substances status: Secondary | ICD-10-CM | POA: Insufficient documentation

## 2018-02-12 DIAGNOSIS — H9319 Tinnitus, unspecified ear: Secondary | ICD-10-CM | POA: Insufficient documentation

## 2018-02-12 DIAGNOSIS — Z881 Allergy status to other antibiotic agents status: Secondary | ICD-10-CM | POA: Insufficient documentation

## 2018-02-12 DIAGNOSIS — Z79899 Other long term (current) drug therapy: Secondary | ICD-10-CM | POA: Insufficient documentation

## 2018-02-12 HISTORY — DX: Migraine, unspecified, not intractable, without status migrainosus: G43.909

## 2018-02-12 HISTORY — DX: Unspecified ovarian cyst, unspecified side: N83.209

## 2018-02-12 HISTORY — DX: Unspecified asthma, uncomplicated: J45.909

## 2018-02-12 HISTORY — DX: Sprain of ligaments of cervical spine, initial encounter: S13.4XXA

## 2018-02-12 HISTORY — DX: Concussion with loss of consciousness status unknown, initial encounter: S06.0XAA

## 2018-02-12 HISTORY — DX: Complex regional pain syndrome I, unspecified: G90.50

## 2018-02-12 HISTORY — DX: Concussion with loss of consciousness of unspecified duration, initial encounter: S06.0X9A

## 2018-02-12 HISTORY — DX: Anesthesia of skin: R20.0

## 2018-02-12 HISTORY — DX: Tinnitus, unspecified ear: H93.19

## 2018-02-12 HISTORY — PX: LIPOMA EXCISION: SHX5283

## 2018-02-12 HISTORY — DX: Other specified congenital malformations of brain: Q04.8

## 2018-02-12 HISTORY — DX: Hypotension, unspecified: I95.9

## 2018-02-12 LAB — POCT PREGNANCY, URINE: Preg Test, Ur: NEGATIVE

## 2018-02-12 SURGERY — EXCISION LIPOMA
Anesthesia: Monitor Anesthesia Care | Site: Back

## 2018-02-12 MED ORDER — HYDROMORPHONE HCL 1 MG/ML IJ SOLN
0.2500 mg | INTRAMUSCULAR | Status: DC | PRN
  Filled 2018-02-12: qty 0.5

## 2018-02-12 MED ORDER — CELECOXIB 200 MG PO CAPS
ORAL_CAPSULE | ORAL | Status: AC
  Filled 2018-02-12: qty 1

## 2018-02-12 MED ORDER — FAMOTIDINE 20 MG PO TABS
20.0000 mg | ORAL_TABLET | Freq: Once | ORAL | Status: AC
  Administered 2018-02-12: 20 mg via ORAL
  Filled 2018-02-12: qty 1

## 2018-02-12 MED ORDER — ACETAMINOPHEN 650 MG RE SUPP
650.0000 mg | RECTAL | Status: DC | PRN
  Filled 2018-02-12: qty 1

## 2018-02-12 MED ORDER — CIPROFLOXACIN IN D5W 400 MG/200ML IV SOLN
INTRAVENOUS | Status: AC
  Filled 2018-02-12: qty 200

## 2018-02-12 MED ORDER — PROMETHAZINE HCL 25 MG/ML IJ SOLN
6.2500 mg | INTRAMUSCULAR | Status: DC | PRN
  Filled 2018-02-12: qty 1

## 2018-02-12 MED ORDER — FAMOTIDINE 20 MG PO TABS
ORAL_TABLET | ORAL | Status: AC
  Filled 2018-02-12: qty 1

## 2018-02-12 MED ORDER — PROPOFOL 10 MG/ML IV BOLUS
INTRAVENOUS | Status: AC
  Filled 2018-02-12: qty 20

## 2018-02-12 MED ORDER — ACETAMINOPHEN 500 MG PO TABS
1000.0000 mg | ORAL_TABLET | Freq: Once | ORAL | Status: AC
  Administered 2018-02-12: 1000 mg via ORAL
  Filled 2018-02-12: qty 2

## 2018-02-12 MED ORDER — ONDANSETRON HCL 4 MG/2ML IJ SOLN
INTRAMUSCULAR | Status: AC
  Filled 2018-02-12: qty 2

## 2018-02-12 MED ORDER — ACETAMINOPHEN 500 MG PO TABS
ORAL_TABLET | ORAL | Status: AC
  Filled 2018-02-12: qty 2

## 2018-02-12 MED ORDER — MIDAZOLAM HCL 2 MG/2ML IJ SOLN
INTRAMUSCULAR | Status: AC
  Filled 2018-02-12: qty 2

## 2018-02-12 MED ORDER — FENTANYL CITRATE (PF) 100 MCG/2ML IJ SOLN
INTRAMUSCULAR | Status: DC | PRN
  Administered 2018-02-12: 50 ug via INTRAVENOUS

## 2018-02-12 MED ORDER — DEXAMETHASONE SODIUM PHOSPHATE 10 MG/ML IJ SOLN
INTRAMUSCULAR | Status: AC
  Filled 2018-02-12: qty 1

## 2018-02-12 MED ORDER — BUPIVACAINE-EPINEPHRINE 0.25% -1:200000 IJ SOLN
INTRAMUSCULAR | Status: DC | PRN
  Administered 2018-02-12: 7 mL

## 2018-02-12 MED ORDER — SODIUM CHLORIDE 0.9 % IV SOLN
250.0000 mL | INTRAVENOUS | Status: DC | PRN
  Filled 2018-02-12: qty 250

## 2018-02-12 MED ORDER — LACTATED RINGERS IV SOLN
INTRAVENOUS | Status: DC
  Administered 2018-02-12: 07:00:00 via INTRAVENOUS
  Filled 2018-02-12: qty 1000

## 2018-02-12 MED ORDER — SODIUM CHLORIDE 0.9% FLUSH
3.0000 mL | Freq: Two times a day (BID) | INTRAVENOUS | Status: DC
  Filled 2018-02-12: qty 3

## 2018-02-12 MED ORDER — PROPOFOL 10 MG/ML IV BOLUS
INTRAVENOUS | Status: AC
Start: 2018-02-12 — End: 2018-02-12
  Filled 2018-02-12: qty 20

## 2018-02-12 MED ORDER — CIPROFLOXACIN IN D5W 400 MG/200ML IV SOLN
400.0000 mg | INTRAVENOUS | Status: AC
  Administered 2018-02-12: 400 mg via INTRAVENOUS
  Filled 2018-02-12: qty 200

## 2018-02-12 MED ORDER — PROPOFOL 500 MG/50ML IV EMUL
INTRAVENOUS | Status: DC | PRN
  Administered 2018-02-12: 100 ug/kg/min via INTRAVENOUS

## 2018-02-12 MED ORDER — FENTANYL CITRATE (PF) 100 MCG/2ML IJ SOLN
INTRAMUSCULAR | Status: AC
  Filled 2018-02-12: qty 2

## 2018-02-12 MED ORDER — ACETAMINOPHEN 325 MG PO TABS
650.0000 mg | ORAL_TABLET | ORAL | Status: DC | PRN
  Filled 2018-02-12: qty 2

## 2018-02-12 MED ORDER — LIDOCAINE 2% (20 MG/ML) 5 ML SYRINGE
INTRAMUSCULAR | Status: DC | PRN
  Administered 2018-02-12: 60 mg via INTRAVENOUS

## 2018-02-12 MED ORDER — SODIUM CHLORIDE 0.9% FLUSH
3.0000 mL | INTRAVENOUS | Status: DC | PRN
  Filled 2018-02-12: qty 3

## 2018-02-12 MED ORDER — MIDAZOLAM HCL 2 MG/2ML IJ SOLN
INTRAMUSCULAR | Status: DC | PRN
  Administered 2018-02-12: 1 mg via INTRAVENOUS

## 2018-02-12 MED ORDER — OXYCODONE HCL 5 MG PO TABS
5.0000 mg | ORAL_TABLET | Freq: Once | ORAL | Status: DC | PRN
  Filled 2018-02-12: qty 1

## 2018-02-12 MED ORDER — OXYCODONE HCL 5 MG/5ML PO SOLN
5.0000 mg | Freq: Once | ORAL | Status: DC | PRN
  Filled 2018-02-12: qty 5

## 2018-02-12 MED ORDER — CELECOXIB 200 MG PO CAPS
200.0000 mg | ORAL_CAPSULE | Freq: Once | ORAL | Status: AC
  Administered 2018-02-12: 200 mg via ORAL
  Filled 2018-02-12: qty 1

## 2018-02-12 MED ORDER — PROPOFOL 10 MG/ML IV BOLUS
INTRAVENOUS | Status: DC | PRN
  Administered 2018-02-12: 30 mg via INTRAVENOUS

## 2018-02-12 MED ORDER — LIDOCAINE 2% (20 MG/ML) 5 ML SYRINGE
INTRAMUSCULAR | Status: AC
  Filled 2018-02-12: qty 5

## 2018-02-12 SURGICAL SUPPLY — 59 items
ADH SKN CLS APL DERMABOND .7 (GAUZE/BANDAGES/DRESSINGS) ×2
BLADE CLIPPER SURG (BLADE) IMPLANT
BLADE SURG 15 STRL LF DISP TIS (BLADE) ×1 IMPLANT
BLADE SURG 15 STRL SS (BLADE) ×2
BNDG CONFORM 2 STRL LF (GAUZE/BANDAGES/DRESSINGS) IMPLANT
BNDG ELASTIC 2X5.8 VLCR STR LF (GAUZE/BANDAGES/DRESSINGS) IMPLANT
CANISTER SUCTION 1200CC (MISCELLANEOUS) IMPLANT
CHLORAPREP W/TINT 26ML (MISCELLANEOUS) ×1 IMPLANT
CORDS BIPOLAR (ELECTRODE) IMPLANT
COVER BACK TABLE 60X90IN (DRAPES) ×2 IMPLANT
COVER MAYO STAND STRL (DRAPES) ×2 IMPLANT
DECANTER SPIKE VIAL GLASS SM (MISCELLANEOUS) ×2 IMPLANT
DERMABOND ADVANCED (GAUZE/BANDAGES/DRESSINGS) ×2
DERMABOND ADVANCED .7 DNX12 (GAUZE/BANDAGES/DRESSINGS) IMPLANT
DRAPE LAPAROTOMY 100X72 PEDS (DRAPES) ×1 IMPLANT
DRAPE LG THREE QUARTER DISP (DRAPES) IMPLANT
DRAPE U 20/CS (DRAPES) IMPLANT
DRSG TEGADERM 2-3/8X2-3/4 SM (GAUZE/BANDAGES/DRESSINGS) IMPLANT
ELECT NDL BLADE 2-5/6 (NEEDLE) ×1 IMPLANT
ELECT NEEDLE BLADE 2-5/6 (NEEDLE) ×2 IMPLANT
ELECT REM PT RETURN 9FT ADLT (ELECTROSURGICAL) ×2
ELECTRODE REM PT RTRN 9FT ADLT (ELECTROSURGICAL) ×1 IMPLANT
GAUZE SPONGE 4X4 12PLY STRL LF (GAUZE/BANDAGES/DRESSINGS) IMPLANT
GAUZE XEROFORM 1X8 LF (GAUZE/BANDAGES/DRESSINGS) IMPLANT
GLOVE BIO SURGEON STRL SZ 6.5 (GLOVE) ×4 IMPLANT
GOWN STRL REUS W/ TWL XL LVL3 (GOWN DISPOSABLE) ×2 IMPLANT
GOWN STRL REUS W/TWL XL LVL3 (GOWN DISPOSABLE) ×6
KIT TURNOVER CYSTO (KITS) ×2 IMPLANT
MANIFOLD NEPTUNE II (INSTRUMENTS) ×1 IMPLANT
NDL HYPO 30GX1 BEV (NEEDLE) ×1 IMPLANT
NEEDLE 27GAX1X1/2 (NEEDLE) IMPLANT
NEEDLE HYPO 30GX1 BEV (NEEDLE) ×2 IMPLANT
NS IRRIG 500ML POUR BTL (IV SOLUTION) ×1 IMPLANT
PACK BASIN DAY SURGERY FS (CUSTOM PROCEDURE TRAY) ×2 IMPLANT
PENCIL BUTTON HOLSTER BLD 10FT (ELECTRODE) ×2 IMPLANT
SPONGE GAUZE 2X2 8PLY STRL LF (GAUZE/BANDAGES/DRESSINGS) IMPLANT
STRIP CLOSURE SKIN 1/2X4 (GAUZE/BANDAGES/DRESSINGS) IMPLANT
SUCTION FRAZIER HANDLE 10FR (MISCELLANEOUS) ×1
SUCTION TUBE FRAZIER 10FR DISP (MISCELLANEOUS) IMPLANT
SUT MNCRL 6-0 UNDY P1 1X18 (SUTURE) IMPLANT
SUT MNCRL AB 3-0 PS2 27 (SUTURE) ×1 IMPLANT
SUT MNCRL AB 4-0 PS2 18 (SUTURE) ×1 IMPLANT
SUT MON AB 5-0 P3 18 (SUTURE) IMPLANT
SUT MON AB 5-0 PS2 18 (SUTURE) ×1 IMPLANT
SUT MONOCRYL 6-0 P1 1X18 (SUTURE)
SUT PROLENE 5 0 P 3 (SUTURE) IMPLANT
SUT PROLENE 5 0 PS 2 (SUTURE) IMPLANT
SUT PROLENE 6 0 P 1 18 (SUTURE) IMPLANT
SUT VIC AB 5-0 P-3 18X BRD (SUTURE) IMPLANT
SUT VIC AB 5-0 P3 18 (SUTURE)
SUT VIC AB 5-0 PS2 18 (SUTURE) IMPLANT
SUT VICRYL 4-0 PS2 18IN ABS (SUTURE) IMPLANT
SYR BULB 3OZ (MISCELLANEOUS) IMPLANT
SYR CONTROL 10ML LL (SYRINGE) ×2 IMPLANT
TOWEL OR 17X24 6PK STRL BLUE (TOWEL DISPOSABLE) ×2 IMPLANT
TRAY DSU PREP LF (CUSTOM PROCEDURE TRAY) ×2 IMPLANT
TUBE CONNECTING 12X1/4 (SUCTIONS) IMPLANT
YANKAUER SUCT BULB TIP NO VENT (SUCTIONS) ×1 IMPLANT
mepilex border 4x4 ×1 IMPLANT

## 2018-02-12 NOTE — Anesthesia Postprocedure Evaluation (Signed)
Anesthesia Post Note  Patient: Susan Foster  Procedure(s) Performed: EXCISION OF BACK LIPOMA (N/A Back)     Patient location during evaluation: PACU Anesthesia Type: MAC Level of consciousness: awake and alert Pain management: pain level controlled Vital Signs Assessment: post-procedure vital signs reviewed and stable Respiratory status: spontaneous breathing, nonlabored ventilation, respiratory function stable and patient connected to nasal cannula oxygen Cardiovascular status: stable and blood pressure returned to baseline Postop Assessment: no apparent nausea or vomiting Anesthetic complications: no    Last Vitals:  Vitals:   02/12/18 1000 02/12/18 1020  BP: 103/66 107/63  Pulse: 62 63  Resp: 15 16  Temp:  36.5 C  SpO2: 100% 100%    Last Pain:  Vitals:   02/12/18 1020  TempSrc:   PainSc: 0-No pain                 Jordane Hisle P Juliet Vasbinder

## 2018-02-12 NOTE — Op Note (Signed)
First Assist Op Note: [facility] I assisted the Surgeon(s) _Dr. Lyndee Leo Dillingham__ on the procedure(s): _____excision of right lower back lipoma_________on Date ___4/4/19______  I provided my assistance on this case as follows:  I was present and acted as first Environmental consultant during this operation. I was present during the patient transport into the operative suite and assisted the OR staff with transferring and positioning of the patient. All extremities were checked and properly cushioned and safety straps in place. I was involved in the prepping and placement of sterile drapes. A time out was performed and all information confirmed to be correct.  I first assisted during the case including retraction for exposure, assisting with closure of surgical wounds and application of sterile dressings. I provided assistance with application of post operative garments/splinting and assisted with patient transfer back to the stretcher as needed.   Lawrence Mitch,PA-C Plastic Surgery [phone]

## 2018-02-12 NOTE — Discharge Instructions (Signed)
May shower tomorrow. No heavy lifting or strenuous activity.   Post Anesthesia Home Care Instructions  Activity: Get plenty of rest for the remainder of the day. A responsible individual must stay with you for 24 hours following the procedure.  For the next 24 hours, DO NOT: -Drive a car -Paediatric nurse -Drink alcoholic beverages -Take any medication unless instructed by your physician -Make any legal decisions or sign important papers.  Meals: Start with liquid foods such as gelatin or soup. Progress to regular foods as tolerated. Avoid greasy, spicy, heavy foods. If nausea and/or vomiting occur, drink only clear liquids until the nausea and/or vomiting subsides. Call your physician if vomiting continues.  Special Instructions/Symptoms: Your throat may feel dry or sore from the anesthesia or the breathing tube placed in your throat during surgery. If this causes discomfort, gargle with warm salt water. The discomfort should disappear within 24 hours.  May take Tylenol for pain as needed. Next dose after 12 PM.

## 2018-02-12 NOTE — Transfer of Care (Signed)
Immediate Anesthesia Transfer of Care Note  Patient: Susan Foster  Procedure(s) Performed: Procedure(s) (LRB): EXCISION OF BACK LIPOMA (N/A)  Patient Location: PACU  Anesthesia Type: MAC  Level of Consciousness: awake, alert , oriented and patient cooperative  Airway & Oxygen Therapy: Patient Spontanous Breathing and Patient connected to face mask oxygen  Post-op Assessment: Report given to PACU RN and Post -op Vital signs reviewed and stable  Post vital signs: Reviewed and stable  Complications: No apparent anesthesia complications Last Vitals:  Vitals Value Taken Time  BP 98/50 02/12/2018  9:35 AM  Temp    Pulse 67 02/12/2018  9:36 AM  Resp    SpO2 100 % 02/12/2018  9:36 AM  Vitals shown include unvalidated device data.  Last Pain:  Vitals:   02/12/18 0727  TempSrc:   PainSc: 5

## 2018-02-12 NOTE — Op Note (Signed)
DATE OF OPERATION: 02/12/2018  LOCATION: [facility]  PREOPERATIVE DIAGNOSIS: right posterior trunk / back lipoma  POSTOPERATIVE DIAGNOSIS: Same  PROCEDURE: Excision of right posterior trunk / back lipoma 5 x 5 cm  SURGEON: Heatherly Stenner Sanger Charnika Herbst, DO  ASSISTANT: Shawn Rayburn, PA  EBL: 5 cc  CONDITION: Stable  COMPLICATIONS: None  INDICATION: The patient, Susan Foster, is a 47 y.o. female born on 02-25-1971, is here for treatment of a chronic growing mass on the posterior right back area.   PROCEDURE DETAILS:  The patient was seen prior to surgery and marked.  The IV antibiotics were given. The patient was taken to the operating room and given an anesthetic. A standard time out was performed and all information was confirmed by those in the room. SCDs were placed.   The patient was placed in the prone position.  Local was injected over the marked site.  After waiting several minutes for the local to take effect the #15 blade was used to incise the skin over the lesion.  The bovie was used to dissect down and around the lipoma looking tissue.  There was nothing concerning the in gross (non microscopic) 5 x 5 cm appearance.  It was fairly deep and I stopped once at the fascia.  Saline was placed to watch for volume and bleeding which there was not any change.  The deep layer was then closed with the 4-0 Monocryl followed by a running 4-0 Monocryl.  Derma bond was applied with a sterile dressing.  The patient was allowed to wake up and taken to recovery room in stable condition at the end of the case. The family was notified at the end of the case.   The advanced practice practitioner assisted throughout the case.  The APP was essential in retraction and counter traction when needed to make the case progress smoothly.  The assistance was needed for blood control, tissue re-approximation and assisted with closure of the incision site.
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
## 2018-02-13 ENCOUNTER — Telehealth: Payer: Self-pay | Admitting: Family Medicine

## 2018-02-13 ENCOUNTER — Encounter (HOSPITAL_BASED_OUTPATIENT_CLINIC_OR_DEPARTMENT_OTHER): Payer: Self-pay | Admitting: Plastic Surgery

## 2018-02-13 NOTE — Telephone Encounter (Signed)
Per a verbal from Dr. Tamala Julian, he would like patient to use her medication on Sun, Tues, Thurs and Saturday for 2 weeks from today. Beginning Monday, April 22nd, she is to take the medication on M, W, and F. Called patient to relay message and left voicemail to call me back.

## 2018-02-13 NOTE — Telephone Encounter (Signed)
Copied from [place]. Topic: Quick Communication - See Telephone Encounter >> Feb 13, 2018  9:40 AM Boyd Kerbs wrote: CRM for notification.   Pt. Called and wants to get off Efexer and wants to know what she needs to do. She has enough medicine for 11 days No appt until week of 22nd.   Please call   See Telephone encounter for: 02/13/18.

## 2018-02-13 NOTE — Telephone Encounter (Signed)
Spoke with patient about her medication schedule for the next couple of weeks until her appointment.
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
## 2018-03-02 ENCOUNTER — Encounter: Payer: Self-pay | Admitting: Family Medicine

## 2018-03-02 ENCOUNTER — Ambulatory Visit (INDEPENDENT_AMBULATORY_CARE_PROVIDER_SITE_OTHER): Payer: BLUE CROSS/BLUE SHIELD | Admitting: Family Medicine

## 2018-03-02 DIAGNOSIS — G9050 Complex regional pain syndrome I, unspecified: Secondary | ICD-10-CM
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
## 2018-03-17 DIAGNOSIS — Z1231 Encounter for screening mammogram for malignant neoplasm of breast: Secondary | ICD-10-CM | POA: Diagnosis not present

## 2018-03-17 DIAGNOSIS — Z01419 Encounter for gynecological examination (general) (routine) without abnormal findings: Secondary | ICD-10-CM | POA: Diagnosis not present

## 2018-03-17 DIAGNOSIS — Z6823 Body mass index (BMI) 23.0-23.9, adult: Secondary | ICD-10-CM | POA: Diagnosis not present

## 2018-04-17 DIAGNOSIS — R635 Abnormal weight gain: Secondary | ICD-10-CM | POA: Diagnosis not present

## 2018-06-26 ENCOUNTER — Encounter: Payer: Self-pay | Admitting: Family Medicine

## 2018-06-26 ENCOUNTER — Ambulatory Visit: Payer: BLUE CROSS/BLUE SHIELD | Admitting: Family Medicine

## 2018-06-26 VITALS — BP 110/68 | HR 87 | Temp 98.3°F | Ht 63.5 in | Wt 133.2 lb

## 2018-06-26 DIAGNOSIS — B001 Herpesviral vesicular dermatitis: Secondary | ICD-10-CM

## 2018-06-26 MED ORDER — VALACYCLOVIR HCL 1 G PO TABS: 1000.0000 mg | ORAL_TABLET | Freq: Two times a day (BID) | ORAL | 5 refills | Status: DC | PRN

## 2018-06-26 NOTE — Progress Notes (Signed)
   Subjective:    Patient ID: Susan Foster, female    DOB: 27-Aug-1971, 47 y.o.   MRN: [id number]  HPI Here for burning and discomfort over the lips and the tongue for the past week. On 06-11-18 she had a dental checkup and they took Xrays in addition to cleaning her teeth. About 3 days after that her lips began to burn, and then they peeled, and then small tiny blisters appeared. At the same time the burning in the tongue began. No ST or trouble swallowing. She has a hx of fever blisters. Usually Abreva helps but not this time.    Review of Systems  Constitutional: Negative.   HENT: Positive for mouth sores.   Respiratory: Negative.   Cardiovascular: Negative.        Objective:   Physical Exam  Constitutional: She appears well-developed and well-nourished.  HENT:  Right Ear: External ear normal.  Left Ear: External ear normal.  Nose: Nose normal.  Both lips are chapped and several small vesicles are present on the lower lip. The OP is clear otherwise   Eyes: Conjunctivae are normal.  Neck: No thyromegaly present.  Pulmonary/Chest: Effort normal and breath sounds normal.  Lymphadenopathy:    She has no cervical adenopathy.          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 Try Valtrex 1000 mg bid as needed.  Alysia Penna, MD

## 2018-09-23 ENCOUNTER — Encounter: Payer: Self-pay | Admitting: Family Medicine
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
## 2018-09-24 ENCOUNTER — Ambulatory Visit (INDEPENDENT_AMBULATORY_CARE_PROVIDER_SITE_OTHER): Payer: BLUE CROSS/BLUE SHIELD | Admitting: Family Medicine

## 2018-09-24 ENCOUNTER — Encounter: Payer: Self-pay | Admitting: Family Medicine

## 2018-09-24 DIAGNOSIS — G9050 Complex regional pain syndrome I, unspecified: Secondary | ICD-10-CM

## 2018-09-24 MED ORDER — VENLAFAXINE HCL ER 37.5 MG PO CP24: 37.5000 mg | ORAL_CAPSULE | Freq: Every day | ORAL | 1 refills | Status: DC

## 2018-09-24 NOTE — Patient Instructions (Signed)
Good to see you  Refilled the effexor I will send in any notes you need I know it stinks and we may need to consider doing this seasonally  Have an appointment in 4 weeks just in case but otherwise see me when you need me
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
## 2018-12-02 ENCOUNTER — Ambulatory Visit: Payer: Self-pay | Admitting: Family Medicine

## 2018-12-02 ENCOUNTER — Encounter: Payer: Self-pay | Admitting: Family Medicine

## 2018-12-02 ENCOUNTER — Ambulatory Visit (INDEPENDENT_AMBULATORY_CARE_PROVIDER_SITE_OTHER): Payer: BLUE CROSS/BLUE SHIELD | Admitting: Family Medicine

## 2018-12-02 ENCOUNTER — Ambulatory Visit: Payer: Self-pay | Admitting: *Deleted

## 2018-12-02 VITALS — BP 106/70 | HR 79 | Temp 97.7°F | Wt 134.1 lb

## 2018-12-02 DIAGNOSIS — K625 Hemorrhage of anus and rectum: Secondary | ICD-10-CM

## 2018-12-02 DIAGNOSIS — K602 Anal fissure, unspecified: Secondary | ICD-10-CM | POA: Diagnosis not present

## 2018-12-02 DIAGNOSIS — S20222A Contusion of left back wall of thorax, initial encounter: Secondary | ICD-10-CM | POA: Diagnosis not present

## 2018-12-02 LAB — POCT URINALYSIS DIPSTICK
Bilirubin, UA: NEGATIVE
Blood, UA: NEGATIVE
GLUCOSE UA: NEGATIVE
KETONES UA: NEGATIVE
Leukocytes, UA: NEGATIVE
NITRITE UA: NEGATIVE
PROTEIN UA: NEGATIVE
Spec Grav, UA: 1.01 (ref 1.010–1.025)
Urobilinogen, UA: 0.2 E.U./dL
pH, UA: 6 (ref 5.0–8.0)

## 2018-12-02 LAB — POCT URINE PREGNANCY: Preg Test, Ur: NEGATIVE

## 2018-12-02 NOTE — Telephone Encounter (Signed)
Noted  

## 2018-12-02 NOTE — Telephone Encounter (Signed)
Contacted pt regarding her symptoms; she states that she fell on 11/28/2018 app 2 inches off step ladder; she landed on her left side kidney area; the pt landed on a door handle; on 11/29/2018 around 1900 she noticed that she had blood (a strip of redness) in her stool, and the water was discolored light pink; she says that she has a history of hemorrhoids when working out; she says that normally when she has a hemorrhoid she can tell; the pt says that she did feel a little constipated in this instance; the pt says that she  is sore from her ribcage to the back; she also has intermittent nausea which started 11/30/2018; the pt took tylenol for the pain/soreness and pain score went from 8 out of 10, down to 5 out of 10; recommendations made per nurse triage protocol; pt previously schedule to see Dr Sarajane Jews, [facility], 12/02/2018 at 1530; appointment rescheduled for 12/02/2018 at 1330; she verbalized understanding; will route to office for notification of this upcoming appointment.         Reason for Disposition . Large bruise of abdominal wall > 2 inches (5 cm)  Answer Assessment - Initial Assessment Questions 1. MECHANISM: "How did the injury happen?"      11/28/2018 2. ONSET: "When did the injury happen?" (Minutes or hours ago)     days 3. LOCATION: "What part of the abdomen is injured?"     Left ribs/kidney area 4. APPEARANCE of INJURY: "What does the injury look like?"     Bruise to left back area 5. PAIN: "Is there any pain?" If so, ask: "How bad is the pain?"  (e.g., Scale 1-10; or mild, moderate, severe)  - MILD - doesn't interfere with normal activities   - MODERATE - interferes with normal activities or awakens from sleep   - SEVERE - patient doesn't want to move (R/O peritonitis, internal bleeding)      Now 5 out of 10 6. SIZE: For cuts, bruises, or swelling, ask: "How large is it?" (e.g., inches or centimeters)     Bruise the size of a softball 7. TETANUS: For any breaks in the skin,  ask: "When was the last tetanus booster?"     n/a 8. OTHER SYMPTOMS: "Do you have any other symptoms?"     Blood in stool (pt ?constipation, and history of hemorrhoids); nausea  9. PREGNANCY: "Is there any chance you are pregnant?" "When was your last menstrual period?"   No LMP 11/16/2018  Protocols used: ABDOMINAL INJURY-A-[facility]

## 2018-12-02 NOTE — Progress Notes (Signed)
   Subjective:    Patient ID: Susan Foster, female    DOB: 03-Aug-1971, 48 y.o.   MRN: [id number]  HPI Here for several concerns. First she has been helping to renovate her parents home and she has been going up and down a small step ladder for several weeks. 3 days ago while on this ladder she slipped and fell off, causing her left middle back to strike the door knob on a door. This was quite painful for a day or two, but it feels better now. However the day after she fell, she was slightly constipated and after passing a stool she saw bright red blood in the toilet. She has had normal BMs since then with no more blood.    Review of Systems  Constitutional: Negative.   Respiratory: Negative.   Cardiovascular: Negative.   Gastrointestinal: Positive for anal bleeding. Negative for abdominal distention, abdominal pain, blood in stool, constipation, diarrhea, nausea, rectal pain and vomiting.  Genitourinary: Negative.   Musculoskeletal: Positive for myalgias.  Neurological: Negative.        Objective:   Physical Exam Constitutional:      Appearance: Normal appearance.  Cardiovascular:     Rate and Rhythm: Normal rate and regular rhythm.     Pulses: Normal pulses.     Heart sounds: Normal heart sounds.  Pulmonary:     Effort: Pulmonary effort is normal.     Breath sounds: Normal breath sounds.  Abdominal:     General: Abdomen is flat. Bowel sounds are normal. There is no distension.     Palpations: Abdomen is soft. There is no mass.     Tenderness: There is no abdominal tenderness. There is no guarding or rebound.     Hernia: No hernia is present.  Genitourinary:    Comments: There is a small partially healed fissure at the anal verge  Musculoskeletal:     Comments: There is a small ecchymosis on the left middle back which slightly tender   Neurological:     General: No focal deficit present.     Mental Status: She is alert and oriented to person, place, and time.            Assessment & Plan:  She had a contusion to the back and this is resolving as expected. She had an anal fissure which produced some blood with a stool, but this is resolving as well. These two incidents are not related in any way. Recheck prn.  Alysia Penna, MD

## 2018-12-02 NOTE — Addendum Note (Signed)
Addended by: Elie Confer on: 12/02/2018 03:24 PM   Modules accepted: Orders

## 2018-12-04 ENCOUNTER — Encounter: Payer: Self-pay | Admitting: *Deleted

## 2019-01-26 DIAGNOSIS — H43393 Other vitreous opacities, bilateral: Secondary | ICD-10-CM | POA: Diagnosis not present

## 2019-01-26 DIAGNOSIS — H40033 Anatomical narrow angle, bilateral: Secondary | ICD-10-CM | POA: Diagnosis not present

## 2019-01-27 ENCOUNTER — Ambulatory Visit (INDEPENDENT_AMBULATORY_CARE_PROVIDER_SITE_OTHER): Payer: BLUE CROSS/BLUE SHIELD | Admitting: Family Medicine

## 2019-01-27 ENCOUNTER — Ambulatory Visit: Payer: Self-pay

## 2019-01-27 ENCOUNTER — Other Ambulatory Visit: Payer: Self-pay

## 2019-01-27 ENCOUNTER — Encounter: Payer: Self-pay | Admitting: Family Medicine

## 2019-01-27 VITALS — BP 104/62 | HR 74 | Temp 98.9°F | Wt 134.1 lb

## 2019-01-27 DIAGNOSIS — R1011 Right upper quadrant pain: Secondary | ICD-10-CM

## 2019-01-27 LAB — HEPATIC FUNCTION PANEL
ALBUMIN: 3.7 g/dL (ref 3.5–5.2)
ALT: 18 U/L (ref 0–35)
AST: 17 U/L (ref 0–37)
Alkaline Phosphatase: 50 U/L (ref 39–117)
BILIRUBIN TOTAL: 0.3 mg/dL (ref 0.2–1.2)
Bilirubin, Direct: 0.1 mg/dL (ref 0.0–0.3)
Total Protein: 6.3 g/dL (ref 6.0–8.3)

## 2019-01-27 LAB — BASIC METABOLIC PANEL
BUN: 14 mg/dL (ref 6–23)
CHLORIDE: 104 meq/L (ref 96–112)
CO2: 28 meq/L (ref 19–32)
Calcium: 8.9 mg/dL (ref 8.4–10.5)
Creatinine, Ser: 0.72 mg/dL (ref 0.40–1.20)
GFR: 86.17 mL/min (ref >60.00)
GLUCOSE: 103 mg/dL — AB (ref 70–99)
POTASSIUM: 4.5 meq/L (ref 3.5–5.1)
Sodium: 139 mEq/L (ref 135–145)

## 2019-01-27 LAB — POCT URINALYSIS DIPSTICK
BILIRUBIN UA: NEGATIVE
Blood, UA: NEGATIVE
Glucose, UA: NEGATIVE
KETONES UA: NEGATIVE
Leukocytes, UA: NEGATIVE
Nitrite, UA: NEGATIVE
PH UA: 7 (ref 5.0–8.0)
Protein, UA: NEGATIVE
SPEC GRAV UA: 1.015 (ref 1.010–1.025)
UROBILINOGEN UA: 0.2 U/dL

## 2019-01-27 LAB — CBC WITH DIFFERENTIAL/PLATELET
Basophils Absolute: 0.1 10*3/uL (ref 0.0–0.1)
Basophils Relative: 1 % (ref 0.0–3.0)
EOS ABS: 0.1 10*3/uL (ref 0.0–0.7)
Eosinophils Relative: 1.9 % (ref 0.0–5.0)
HCT: 37.9 % (ref 36.0–46.0)
HEMOGLOBIN: 13 g/dL (ref 12.0–15.0)
LYMPHS ABS: 2.2 10*3/uL (ref 0.7–4.0)
Lymphocytes Relative: 34.4 % (ref 12.0–46.0)
MCHC: 34.3 g/dL (ref 30.0–36.0)
MCV: 89 fl (ref 78.0–100.0)
Monocytes Absolute: 0.5 10*3/uL (ref 0.1–1.0)
Monocytes Relative: 8.5 % (ref 3.0–12.0)
NEUTROS PCT: 54.2 % (ref 43.0–77.0)
Neutro Abs: 3.4 10*3/uL (ref 1.4–7.7)
Platelets: 294 10*3/uL (ref 150.0–400.0)
RBC: 4.26 Mil/uL (ref 3.87–5.11)
RDW: 12.8 % (ref 11.5–15.5)
WBC: 6.3 10*3/uL (ref 4.0–10.5)

## 2019-01-27 LAB — LIPASE: LIPASE: 10 U/L — AB (ref 11.0–59.0)

## 2019-01-27 LAB — AMYLASE: Amylase: 51 U/L (ref 27–131)

## 2019-01-27 NOTE — Progress Notes (Signed)
   Subjective:    Patient ID: Susan Foster, female    DOB: November 25, 1970, 48 y.o.   MRN: [id number]  HPI Here for 2 weeks of intermittent RUQ and epigastric pains and a loss of appetite. She has had slight nausea but no vomiting. No fever. Her stools have had a normal consistency but the color has been lighter and chalky. No urinary symptoms.   Review of Systems  Constitutional: Negative.   Respiratory: Negative.   Cardiovascular: Negative.   Gastrointestinal: Positive for abdominal pain and nausea. Negative for abdominal distention, anal bleeding, blood in stool, constipation, diarrhea and vomiting.  Genitourinary: Negative.        Objective:   Physical Exam Constitutional:      Appearance: Normal appearance. She is well-developed. She is not ill-appearing.  Eyes:     General: No scleral icterus. Cardiovascular:     Rate and Rhythm: Normal rate and regular rhythm.     Pulses: Normal pulses.     Heart sounds: Normal heart sounds.  Pulmonary:     Effort: Pulmonary effort is normal.     Breath sounds: Normal breath sounds.  Abdominal:     General: Abdomen is flat. Bowel sounds are normal. There is no distension.     Palpations: Abdomen is soft. There is no mass.     Tenderness: There is no guarding or rebound.     Hernia: No hernia is present.     Comments: Moderately tender in the epigastrium and RUQ  Neurological:     Mental Status: She is alert.           Assessment & Plan:  RUQ pain, likely gall bladder disease. Avoid fatty foods and dairy products. Get labs and set up an [place] soon.  Alysia Penna, MD

## 2019-01-27 NOTE — Addendum Note (Signed)
Addended by: Elie Confer on: 01/27/2019 11:30 AM   Modules accepted: Orders

## 2019-01-27 NOTE — Telephone Encounter (Signed)
Pt began having abdominal pain 01/16/19. Pain came on suddenly. Pt had 1 episode of vomiting last Saturday. Pt stated she is having mild sharp pain above the navel that radiates to the RUQ of the abdomen. Pain is constant. Last BM was this morning and was "ash" colored. Pt also c/o bilateral shoulder pain and mid back pain. Pt thinks it could be her gallbladder. Care advice given and pt given an appt with Dr Sarajane Jews this morning. Pt informed to be NPO and pt verbalized understanding.   Reason for Disposition . [1] MILD-MODERATE pain AND [2] constant AND [3] present > 2 hours  Answer Assessment - Initial Assessment Questions 1. LOCATION: "Where does it hurt?"      Above navel 2. RADIATION: "Does the pain shoot anywhere else?" (e.g., chest, back)     Pain to both shoulders-midback 3. ONSET: "When did the pain begin?" (e.g., minutes, hours or days ago)      01/16/19 4. SUDDEN: "Gradual or sudden onset?"     sudden 5. PATTERN "Does the pain come and go, or is it constant?"    - If constant: "Is it getting better, staying the same, or worsening?"      (Note: Constant means the pain never goes away completely; most serious pain is constant and it progresses)     - If intermittent: "How long does it last?" "Do you have pain now?"     (Note: Intermittent means the pain goes away completely between bouts)     Constant  6. SEVERITY: "How bad is the pain?"  (e.g., Scale 1-10; mild, moderate, or severe)   - MILD (1-3): doesn't interfere with normal activities, abdomen soft and not tender to touch    - MODERATE (4-7): interferes with normal activities or awakens from sleep, tender to touch    - SEVERE (8-10): excruciating pain, doubled over, unable to do any normal activities      Mild- sharp pain above navel that right upper abdomen 7. RECURRENT SYMPTOM: "Have you ever had this type of abdominal pain before?" If so, ask: "When was the last time?" and "What happened that time?"      no 8. CAUSE: "What do you  think is causing the abdominal pain?"     gallbladder 9. RELIEVING/AGGRAVATING FACTORS: "What makes it better or worse?" (e.g., movement, antacids, bowel movement)     Nothing makes pain better- standing up and moving 10. OTHER SYMPTOMS: "Has there been any vomiting, diarrhea, constipation, or urine problems?"       Vomited last Saturday none since, stool is ash colored, belching- abdomen - last BM this ash colored-water and poptart 0830, poor appetite 11. PREGNANCY: "Is there any chance you are pregnant?" "When was your last menstrual period?"       N/a-LMP:march 3rd  Protocols used: ABDOMINAL PAIN - [facility]

## 2019-01-29 ENCOUNTER — Ambulatory Visit
Admission: RE | Admit: 2019-01-29 | Discharge: 2019-01-29 | Disposition: A | Discharge status: Home / Self Care | Payer: BLUE CROSS/BLUE SHIELD | Source: Ambulatory Visit | Attending: Family Medicine | Admitting: Family Medicine

## 2019-01-29 DIAGNOSIS — R1011 Right upper quadrant pain: Secondary | ICD-10-CM | POA: Diagnosis not present

## 2019-02-02 ENCOUNTER — Telehealth: Payer: Self-pay | Admitting: *Deleted

## 2019-02-02 DIAGNOSIS — R1010 Upper abdominal pain, unspecified: Secondary | ICD-10-CM

## 2019-02-02 NOTE — Telephone Encounter (Signed)
Please give her a call 

## 2019-02-02 NOTE — Telephone Encounter (Signed)
Copied from [place] [id number]. Topic: Quick Communication - Other Results (Clinic Use ONLY) >> Feb 01, 2019  1:09 PM Scherrie Gerlach wrote: Pt states she got results of [place] on mychart, but would like a call back because she is not sure she interprets correctly.   Dr. Sarajane Jews please advise thanks

## 2019-02-02 NOTE — Telephone Encounter (Signed)
Pt Called back and is sorry missed call .. working from home on cell.. please call back and she will try to get the call. [phone]

## 2019-02-02 NOTE — Telephone Encounter (Signed)
Per Dr. Sarajane Jews he did say that her GB and liver look fine on the [place].  I have called and LMOMTCB x 1

## 2019-02-03 NOTE — Telephone Encounter (Signed)
Called and spoke with pt and she stated that she is not taking any meds for reflux.  She stated that this pain she is having is not related to reflux.  She said that on Friday she was hurting and felt better over the weekend. Sunday evening she ate some pistachio pudding that she made at home with almond milk and she had the worse pain after that.  She stated that something is going on and she feels that omeprazole will not help this.  Dr. Sarajane Jews please advise. Thanks

## 2019-02-03 NOTE — Telephone Encounter (Signed)
I will refer her to GI, she may need an upper endoscopy

## 2019-05-06 DIAGNOSIS — Z6823 Body mass index (BMI) 23.0-23.9, adult: Secondary | ICD-10-CM | POA: Diagnosis not present

## 2019-05-06 DIAGNOSIS — Z1231 Encounter for screening mammogram for malignant neoplasm of breast: Secondary | ICD-10-CM | POA: Diagnosis not present

## 2019-05-06 DIAGNOSIS — Z01419 Encounter for gynecological examination (general) (routine) without abnormal findings: Secondary | ICD-10-CM | POA: Diagnosis not present

## 2019-05-07 ENCOUNTER — Other Ambulatory Visit: Payer: Self-pay | Admitting: Obstetrics and Gynecology

## 2019-05-07 DIAGNOSIS — R928 Other abnormal and inconclusive findings on diagnostic imaging of breast: Secondary | ICD-10-CM

## 2019-05-12 ENCOUNTER — Other Ambulatory Visit: Payer: Self-pay

## 2019-05-12 ENCOUNTER — Ambulatory Visit
Admission: RE | Admit: 2019-05-12 | Discharge: 2019-05-12 | Disposition: A | Discharge status: Home / Self Care | Payer: BC Managed Care – PPO | Source: Ambulatory Visit | Attending: Obstetrics and Gynecology | Admitting: Obstetrics and Gynecology

## 2019-05-12 ENCOUNTER — Ambulatory Visit
Admission: RE | Admit: 2019-05-12 | Discharge: 2019-05-12 | Disposition: A | Discharge status: Home / Self Care | Payer: BLUE CROSS/BLUE SHIELD | Source: Ambulatory Visit | Attending: Obstetrics and Gynecology | Admitting: Obstetrics and Gynecology

## 2019-05-12 DIAGNOSIS — R928 Other abnormal and inconclusive findings on diagnostic imaging of breast: Secondary | ICD-10-CM

## 2019-05-12 DIAGNOSIS — N6489 Other specified disorders of breast: Secondary | ICD-10-CM | POA: Diagnosis not present

## 2019-05-12 DIAGNOSIS — R922 Inconclusive mammogram: Secondary | ICD-10-CM | POA: Diagnosis not present

## 2019-07-09 ENCOUNTER — Other Ambulatory Visit: Payer: BC Managed Care – PPO

## 2019-07-09 ENCOUNTER — Other Ambulatory Visit: Payer: Self-pay

## 2019-07-09 ENCOUNTER — Ambulatory Visit (INDEPENDENT_AMBULATORY_CARE_PROVIDER_SITE_OTHER): Payer: BC Managed Care – PPO | Admitting: Family Medicine

## 2019-07-09 ENCOUNTER — Ambulatory Visit (INDEPENDENT_AMBULATORY_CARE_PROVIDER_SITE_OTHER): Payer: BC Managed Care – PPO

## 2019-07-09 ENCOUNTER — Other Ambulatory Visit: Payer: Self-pay | Admitting: Family Medicine

## 2019-07-09 ENCOUNTER — Encounter: Payer: Self-pay | Admitting: Family Medicine

## 2019-07-09 VITALS — BP 110/60 | HR 77 | Temp 98.6°F | Wt 139.4 lb

## 2019-07-09 DIAGNOSIS — R222 Localized swelling, mass and lump, trunk: Secondary | ICD-10-CM

## 2019-07-09 DIAGNOSIS — R221 Localized swelling, mass and lump, neck: Secondary | ICD-10-CM | POA: Diagnosis not present

## 2019-07-09 LAB — CBC WITH DIFFERENTIAL/PLATELET
Basophils Absolute: 0.1 10*3/uL (ref 0.0–0.1)
Basophils Relative: 0.8 % (ref 0.0–3.0)
Eosinophils Absolute: 0.1 10*3/uL (ref 0.0–0.7)
Eosinophils Relative: 1.8 % (ref 0.0–5.0)
HCT: 37.8 % (ref 36.0–46.0)
Hemoglobin: 12.8 g/dL (ref 12.0–15.0)
Lymphocytes Relative: 35.9 % (ref 12.0–46.0)
Lymphs Abs: 2.8 10*3/uL (ref 0.7–4.0)
MCHC: 33.8 g/dL (ref 30.0–36.0)
MCV: 89.7 fl (ref 78.0–100.0)
Monocytes Absolute: 0.6 10*3/uL (ref 0.1–1.0)
Monocytes Relative: 7.7 % (ref 3.0–12.0)
Neutro Abs: 4.2 10*3/uL (ref 1.4–7.7)
Neutrophils Relative %: 53.8 % (ref 43.0–77.0)
Platelets: 259 10*3/uL (ref 150.0–400.0)
RBC: 4.22 Mil/uL (ref 3.87–5.11)
RDW: 12.6 % (ref 11.5–15.5)
WBC: 7.8 10*3/uL (ref 4.0–10.5)

## 2019-07-09 NOTE — Progress Notes (Signed)
   Subjective:    Patient ID: Susan Foster, female    DOB: Mar 14, 1971, 48 y.o.   MRN: [id number]  HPI Here for issues in the right neck area. About 2 months ago she noticed swelling in the right neck area and above the right clavicle. No tednerness. She notes that she had a dental cleaning and some other dental work around that same time. The swelling has persisted. Then one week ago she noticed a mildly tender knot along the right side of the neck. No scalp rashes. No recent piercings. No fever. No cough or SOB or chest pain.    Review of Systems  Constitutional: Negative.   HENT: Negative.   Eyes: Negative.   Respiratory: Negative.   Cardiovascular: Negative.   Hematological: Positive for adenopathy.       Objective:   Physical Exam Constitutional:      Appearance: Normal appearance. She is not ill-appearing.  HENT:     Right Ear: Tympanic membrane, ear canal and external ear normal.     Left Ear: Tympanic membrane, ear canal and external ear normal.     Nose: Nose normal.     Mouth/Throat:     Pharynx: Oropharynx is clear.  Eyes:     Conjunctiva/sclera: Conjunctivae normal.  Neck:     Musculoskeletal: Normal range of motion.     Comments: The area superior to the right clavicle is swollen but not tender. There is also a discrete firm tender nodule along the right sternocleidomastoid muscle  Cardiovascular:     Rate and Rhythm: Normal rate and regular rhythm.     Pulses: Normal pulses.     Heart sounds: Normal heart sounds.  Pulmonary:     Effort: Pulmonary effort is normal. No respiratory distress.     Breath sounds: Normal breath sounds. No stridor. No wheezing, rhonchi or rales.  Neurological:     Mental Status: She is alert.           Assessment & Plan:  Right supraclavicular fullness and an inflamed right SCM node. Etiology is unclear. We will get a CBC and a CXR today. Set up a neck US next week.  Alysia Penna, MD

## 2019-07-13 ENCOUNTER — Telehealth: Payer: Self-pay

## 2019-07-13 NOTE — Telephone Encounter (Signed)
Left message for patient to call back. CRM created 

## 2019-07-13 NOTE — Telephone Encounter (Signed)
Copied from [place] [id number]. Topic: General - Other >> Jul 13, 2019  7:04 AM Rayann Heman wrote: Reason for CRM: pt called and stated that she would like a call back from the nurse regarding labs. Pt states that she is still having shoulder blade to ear pain. Please advise

## 2019-07-14 ENCOUNTER — Ambulatory Visit
Admission: RE | Admit: 2019-07-14 | Discharge: 2019-07-14 | Disposition: A | Discharge status: Home / Self Care | Payer: BC Managed Care – PPO | Source: Ambulatory Visit | Attending: Family Medicine | Admitting: Family Medicine

## 2019-07-14 DIAGNOSIS — R221 Localized swelling, mass and lump, neck: Secondary | ICD-10-CM | POA: Diagnosis not present

## 2019-07-14 DIAGNOSIS — R222 Localized swelling, mass and lump, trunk: Secondary | ICD-10-CM

## 2019-07-16 ENCOUNTER — Encounter: Payer: Self-pay | Admitting: Family Medicine

## 2019-07-16 NOTE — Addendum Note (Signed)
Addended by: Alysia Penna A on: 07/16/2019 08:18 AM   Modules accepted: Orders

## 2019-07-16 NOTE — Telephone Encounter (Deleted)
I think the best thing would be to set up another visit (Doxy is fine) to talk about our options

## 2019-07-16 NOTE — Telephone Encounter (Signed)
I talked to her over the phone about the [place] results and we agreed to proceed with CT scans of the neck and chest

## 2019-07-20 NOTE — Telephone Encounter (Signed)
Left message for patient to call back  

## 2019-07-27 ENCOUNTER — Ambulatory Visit (INDEPENDENT_AMBULATORY_CARE_PROVIDER_SITE_OTHER)
Admission: RE | Admit: 2019-07-27 | Discharge: 2019-07-27 | Disposition: A | Discharge status: Home / Self Care | Payer: BC Managed Care – PPO | Source: Ambulatory Visit | Attending: Family Medicine | Admitting: Family Medicine

## 2019-07-27 ENCOUNTER — Other Ambulatory Visit: Payer: Self-pay

## 2019-07-27 DIAGNOSIS — R221 Localized swelling, mass and lump, neck: Secondary | ICD-10-CM

## 2019-07-27 MED ORDER — IOHEXOL 300 MG/ML  SOLN
80.0000 mL | Freq: Once | INTRAMUSCULAR | Status: AC | PRN
  Administered 2019-07-27: 80 mL via INTRAVENOUS

## 2019-07-29 ENCOUNTER — Encounter: Payer: Self-pay | Admitting: Family Medicine

## 2019-07-29 DIAGNOSIS — R590 Localized enlarged lymph nodes: Secondary | ICD-10-CM

## 2019-07-29 NOTE — Addendum Note (Signed)
Addended by: Alysia Penna A on: 07/29/2019 08:07 AM   Modules accepted: Orders

## 2019-07-30 DIAGNOSIS — R591 Generalized enlarged lymph nodes: Secondary | ICD-10-CM | POA: Diagnosis not present

## 2019-07-30 DIAGNOSIS — Z9089 Acquired absence of other organs: Secondary | ICD-10-CM | POA: Diagnosis not present

## 2019-07-30 DIAGNOSIS — Z7289 Other problems related to lifestyle: Secondary | ICD-10-CM | POA: Diagnosis not present

## 2019-07-30 NOTE — Telephone Encounter (Signed)
I order the EBV titers. She can make a lab appt

## 2019-08-02 ENCOUNTER — Other Ambulatory Visit (HOSPITAL_COMMUNITY): Payer: Self-pay | Admitting: Otolaryngology

## 2019-08-02 ENCOUNTER — Other Ambulatory Visit: Payer: Self-pay | Admitting: Otolaryngology

## 2019-08-02 DIAGNOSIS — R591 Generalized enlarged lymph nodes: Secondary | ICD-10-CM

## 2019-08-02 DIAGNOSIS — R221 Localized swelling, mass and lump, neck: Secondary | ICD-10-CM

## 2019-08-03 ENCOUNTER — Other Ambulatory Visit: Payer: Self-pay | Admitting: Radiology

## 2019-08-04 ENCOUNTER — Encounter (HOSPITAL_COMMUNITY): Payer: Self-pay

## 2019-08-04 ENCOUNTER — Other Ambulatory Visit: Payer: Self-pay

## 2019-08-04 ENCOUNTER — Ambulatory Visit (HOSPITAL_COMMUNITY)
Admission: RE | Admit: 2019-08-04 | Discharge: 2019-08-04 | Disposition: A | Discharge status: Home / Self Care | Payer: BC Managed Care – PPO | Source: Ambulatory Visit | Attending: Otolaryngology | Admitting: Otolaryngology

## 2019-08-04 DIAGNOSIS — H9319 Tinnitus, unspecified ear: Secondary | ICD-10-CM | POA: Insufficient documentation

## 2019-08-04 DIAGNOSIS — J45909 Unspecified asthma, uncomplicated: Secondary | ICD-10-CM | POA: Diagnosis not present

## 2019-08-04 DIAGNOSIS — Z79899 Other long term (current) drug therapy: Secondary | ICD-10-CM | POA: Diagnosis not present

## 2019-08-04 DIAGNOSIS — K219 Gastro-esophageal reflux disease without esophagitis: Secondary | ICD-10-CM | POA: Insufficient documentation

## 2019-08-04 DIAGNOSIS — R221 Localized swelling, mass and lump, neck: Secondary | ICD-10-CM

## 2019-08-04 DIAGNOSIS — R590 Localized enlarged lymph nodes: Secondary | ICD-10-CM | POA: Diagnosis not present

## 2019-08-04 DIAGNOSIS — R591 Generalized enlarged lymph nodes: Secondary | ICD-10-CM

## 2019-08-04 LAB — CBC
HCT: 39.4 % (ref 36.0–46.0)
Hemoglobin: 13.6 g/dL (ref 12.0–15.0)
MCH: 30.7 pg (ref 26.0–34.0)
MCHC: 34.5 g/dL (ref 30.0–36.0)
MCV: 88.9 fL (ref 80.0–100.0)
Platelets: 273 10*3/uL (ref 150–400)
RBC: 4.43 MIL/uL (ref 3.87–5.11)
RDW: 12 % (ref 11.5–15.5)
WBC: 7.1 10*3/uL (ref 4.0–10.5)
nRBC: 0 % (ref 0.0–0.2)

## 2019-08-04 LAB — PROTIME-INR
INR: 1 (ref 0.8–1.2)
Prothrombin Time: 13.1 seconds (ref 11.4–15.2)

## 2019-08-04 MED ORDER — SODIUM CHLORIDE 0.9 % IV SOLN: INTRAVENOUS | Status: DC

## 2019-08-04 MED ORDER — LIDOCAINE HCL (PF) 1 % IJ SOLN
INTRAMUSCULAR | Status: AC
  Filled 2019-08-04: qty 30

## 2019-08-04 NOTE — Discharge Instructions (Addendum)
Needle Biopsy, Care After °This sheet gives you information about how to care for yourself after your procedure. Your health care provider may also give you more specific instructions. If you have problems or questions, contact your health care provider. °What can I expect after the procedure? °After the procedure, it is common to have soreness, bruising, or mild pain at the puncture site. This should go away in a few days. °Follow these instructions at home: °Needle insertion site care ° °· Wash your hands with soap and water before you change your bandage (dressing). If you cannot use soap and water, use hand sanitizer. °· Follow instructions from your health care provider about how to take care of your puncture site. This includes: °? When and how to change your dressing. °? When to remove your dressing. °· Check your puncture site every day for signs of infection. Check for: °? Redness, swelling, or pain. °? Fluid or blood. °? Pus or a bad smell. °? Warmth. °General instructions °· Return to your normal activities as told by your health care provider. Ask your health care provider what activities are safe for you. °· Do not take baths, swim, or use a hot tub until your health care provider approves. Ask your health care provider if you may take showers. You may only be allowed to take sponge baths. °· Take over-the-counter and prescription medicines only as told by your health care provider. °· Keep all follow-up visits as told by your health care provider. This is important. °Contact a health care provider if: °· You have a fever. °· You have redness, swelling, or pain at the puncture site that lasts longer than a few days. °· You have fluid, blood, or pus coming from your puncture site. °· Your puncture site feels warm to the touch. °Get help right away if: °· You have severe bleeding from the puncture site. °Summary °· After the procedure, it is common to have soreness, bruising, or mild pain at the puncture  site. This should go away in a few days. °· Check your puncture site every day for signs of infection, such as redness, swelling, or pain. °· Get help right away if you have severe bleeding from your puncture site. °This information is not intended to replace advice given to you by your health care provider. Make sure you discuss any questions you have with your health care provider. °Document Released: 03/14/2015 Document Revised: 01/09/2018 Document Reviewed: 11/10/2017 °Elsevier Patient Education © 2020 [employer] °Moderate Conscious Sedation, Adult, Care After °These instructions provide you with information about caring for yourself after your procedure. Your health care provider may also give you more specific instructions. Your treatment has been planned according to current medical practices, but problems sometimes occur. Call your health care provider if you have any problems or questions after your procedure. °What can I expect after the procedure? °After your procedure, it is common: °· To feel sleepy for several hours. °· To feel clumsy and have poor balance for several hours. °· To have poor judgment for several hours. °· To vomit if you eat too soon. °Follow these instructions at home: °For at least 24 hours after the procedure: ° °· Do not: °? Participate in activities where you could fall or become injured. °? Drive. °? Use heavy machinery. °? Drink alcohol. °? Take sleeping pills or medicines that cause drowsiness. °? Make important decisions or sign legal documents. °? Take care of children on your own. °· Rest. °Eating and   drinking °· Follow the diet recommended by your health care provider. °· If you vomit: °? Drink water, juice, or soup when you can drink without vomiting. °? Make sure you have little or no nausea before eating solid foods. °General instructions °· Have a responsible adult stay with you until you are awake and alert. °· Take over-the-counter and prescription medicines only as  told by your health care provider. °· If you smoke, do not smoke without supervision. °· Keep all follow-up visits as told by your health care provider. This is important. °Contact a health care provider if: °· You keep feeling nauseous or you keep vomiting. °· You feel light-headed. °· You develop a rash. °· You have a fever. °Get help right away if: °· You have trouble breathing. °This information is not intended to replace advice given to you by your health care provider. Make sure you discuss any questions you have with your health care provider. °Document Released: 08/18/2013 Document Revised: 10/10/2017 Document Reviewed: 02/17/2016 °Elsevier Patient Education © 2020 [employer] ° °
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
## 2019-08-04 NOTE — Procedures (Signed)
Interventional Radiology Procedure Note  Procedure: US guided core biopsy RIGHT cervical lymph node  Complications: None  Estimated Blood Loss: None  Recommendations: - DC home - Path is pending  Signed,  Criselda Peaches, MD

## 2019-08-05 LAB — SURGICAL PATHOLOGY

## 2019-08-05 NOTE — Telephone Encounter (Signed)
Copied from [place] [id number]. Topic: Quick Communication - Other Results (Clinic Use ONLY) >> Jul 29, 2019  7:41 AM Lennox Solders wrote: Pt would like 2 ct scan she had on Tuesday at [facility] [address]

## 2019-08-06 ENCOUNTER — Other Ambulatory Visit: Payer: Self-pay

## 2019-08-06 NOTE — Telephone Encounter (Signed)
I already discussed the CT scan results with her. She has seen ENY and the node was biopsied

## 2019-09-20 DIAGNOSIS — D491 Neoplasm of unspecified behavior of respiratory system: Secondary | ICD-10-CM | POA: Diagnosis not present

## 2019-09-20 DIAGNOSIS — H938X3 Other specified disorders of ear, bilateral: Secondary | ICD-10-CM | POA: Diagnosis not present

## 2019-09-20 DIAGNOSIS — R591 Generalized enlarged lymph nodes: Secondary | ICD-10-CM | POA: Diagnosis not present

## 2019-09-20 DIAGNOSIS — Z7289 Other problems related to lifestyle: Secondary | ICD-10-CM | POA: Diagnosis not present

## 2019-10-12 DIAGNOSIS — N766 Ulceration of vulva: Secondary | ICD-10-CM | POA: Diagnosis not present

## 2019-10-12 DIAGNOSIS — Z6824 Body mass index (BMI) 24.0-24.9, adult: Secondary | ICD-10-CM | POA: Diagnosis not present

## 2019-12-08 ENCOUNTER — Encounter: Payer: Self-pay | Admitting: Family Medicine

## 2019-12-08 ENCOUNTER — Other Ambulatory Visit: Payer: Self-pay

## 2019-12-08 ENCOUNTER — Ambulatory Visit (INDEPENDENT_AMBULATORY_CARE_PROVIDER_SITE_OTHER): Payer: BC Managed Care – PPO | Admitting: Family Medicine

## 2019-12-08 VITALS — BP 120/64 | HR 82 | Temp 97.7°F | Wt 138.6 lb

## 2019-12-08 DIAGNOSIS — M940 Chondrocostal junction syndrome [Tietze]: Secondary | ICD-10-CM | POA: Diagnosis not present

## 2019-12-08 NOTE — Progress Notes (Signed)
   Subjective:    Patient ID: Susan Foster, female    DOB: 07-06-1971, 49 y.o.   MRN: [id number]  HPI Here for 3 days of intermittent mild tingling or sharp pains in the left chest and around the left shoulder blade. Sometimes it hurts to twist her body or take a deep breath. No coughing or SOB or fever. Sometimes she feels slight numbness in the left hand. She normally works out at [employer] 3 days a week.    Review of Systems  Constitutional: Negative.   Respiratory: Negative.   Cardiovascular: Positive for chest pain. Negative for palpitations and leg swelling.  Musculoskeletal: Positive for back pain.  Neurological: Positive for numbness. Negative for weakness.       Objective:   Physical Exam Constitutional:      Appearance: Normal appearance. She is well-developed. She is not ill-appearing.  Cardiovascular:     Rate and Rhythm: Normal rate and regular rhythm.     Pulses: Normal pulses.     Heart sounds: Normal heart sounds.  Pulmonary:     Effort: Pulmonary effort is normal. No respiratory distress.     Breath sounds: Normal breath sounds. No stridor. No wheezing, rhonchi or rales.     Comments: Mildly tender along the left sternal border Neurological:     General: No focal deficit present.     Mental Status: She is alert and oriented to person, place, and time. Mental status is at baseline.           Assessment & Plan:  Costochondritis, she may use Ibuprofen as needed. This should resolve on its own in a few days.  Alysia Penna, MD

## 2020-02-07 ENCOUNTER — Telehealth: Payer: Self-pay | Admitting: Family Medicine

## 2020-02-07 MED ORDER — HYDROCODONE-HOMATROPINE 5-1.5 MG/5ML PO SYRP: 5.0000 mL | ORAL_SOLUTION | ORAL | 0 refills | Status: DC | PRN

## 2020-02-07 NOTE — Telephone Encounter (Signed)
Pt states her seasonal allergies are acting up and would like the refill for Hydrocodone-homatropine cough syrup.  She also stated she went and got tested for COVID so she knows it is not from that.   Medication Refill: Hydrocodone-homatropine  [facility]: [facility]: [phone]

## 2020-02-07 NOTE — Telephone Encounter (Signed)
Please advise. Rx is no longer on the patients med list.

## 2020-02-07 NOTE — Telephone Encounter (Signed)
Done

## 2020-02-07 NOTE — Telephone Encounter (Signed)
The patient now lives in [place] and wants to know if the cough [facility]  [id number] [place] [place] - [address] AT [facility] Phone:  [phone]  Fax:  [phone]

## 2020-02-08 ENCOUNTER — Encounter: Payer: Self-pay | Admitting: Family Medicine

## 2020-02-08 ENCOUNTER — Telehealth (INDEPENDENT_AMBULATORY_CARE_PROVIDER_SITE_OTHER): Payer: BC Managed Care – PPO | Admitting: Family Medicine

## 2020-02-08 DIAGNOSIS — J45909 Unspecified asthma, uncomplicated: Secondary | ICD-10-CM | POA: Diagnosis not present

## 2020-02-08 DIAGNOSIS — J3089 Other allergic rhinitis: Secondary | ICD-10-CM

## 2020-02-08 MED ORDER — METHYLPREDNISOLONE 4 MG PO TBPK: ORAL_TABLET | ORAL | 0 refills | Status: DC
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
## 2020-02-14 ENCOUNTER — Telehealth: Payer: Self-pay | Admitting: Family Medicine

## 2020-02-14 NOTE — Telephone Encounter (Signed)
The patient was prescribed prednisone and it is not working. She thinks it's just seasonal allergies/sinus. She had a rapid COVID test at the local pharmacy and it was negative.   The patient is wanting to know if Dr. Sarajane Jews can call her in something else.  Please advise

## 2020-02-15 MED ORDER — AZITHROMYCIN 250 MG PO TABS: ORAL_TABLET | ORAL | 0 refills | Status: DC

## 2020-02-15 NOTE — Telephone Encounter (Signed)
Left message for patient to call back. Rx sent to correct pharmacy

## 2020-02-15 NOTE — Addendum Note (Signed)
Addended by: Rebecca Eaton on: 02/15/2020 11:27 AM   Modules accepted: Orders

## 2020-02-15 NOTE — Addendum Note (Signed)
Addended by: Rebecca Eaton on: 02/15/2020 02:36 PM   Modules accepted: Orders

## 2020-02-15 NOTE — Telephone Encounter (Signed)
Call her in a Zpack

## 2020-02-15 NOTE — Telephone Encounter (Signed)
Pt would like the CMA to call her because she is wondering what the Zpak is. She also stated the congestion is R. Nasal passage into the eye and R. Ear. She feels it is puffy in that area as well.   Pt also needs her medication sent to the [facility]: [phone]   Pt can be reached at [phone]

## 2020-03-02 DIAGNOSIS — H1045 Other chronic allergic conjunctivitis: Secondary | ICD-10-CM | POA: Diagnosis not present

## 2020-05-19 ENCOUNTER — Telehealth: Payer: Self-pay | Admitting: Family Medicine

## 2020-05-19 DIAGNOSIS — G629 Polyneuropathy, unspecified: Secondary | ICD-10-CM

## 2020-05-19 NOTE — Telephone Encounter (Signed)
Spoke to pt to get clarification. Pt stated that the sx she is having is tingling in feet, legs jaw and hands. PT articulated that she thought she was having back issues so she was going to PT and massage therapy. Pt was told she needed to see a Chiropractor by the massage therapist. However, when pt went to see the chiropractor, the Chiropractor advise she see neuro. Pt needs a referral since she has not been seen in 3 years.   Please advise if pt needs a ov.
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
## 2020-05-22 NOTE — Telephone Encounter (Signed)
Left detailed message informing  of update. 

## 2020-05-22 NOTE — Telephone Encounter (Signed)
The referral was done  

## 2020-05-23 ENCOUNTER — Encounter: Payer: Self-pay | Admitting: Neurology

## 2020-06-23 DIAGNOSIS — Z124 Encounter for screening for malignant neoplasm of cervix: Secondary | ICD-10-CM | POA: Diagnosis not present

## 2020-06-23 DIAGNOSIS — Z1231 Encounter for screening mammogram for malignant neoplasm of breast: Secondary | ICD-10-CM | POA: Diagnosis not present

## 2020-06-23 DIAGNOSIS — Z6824 Body mass index (BMI) 24.0-24.9, adult: Secondary | ICD-10-CM | POA: Diagnosis not present

## 2020-06-23 DIAGNOSIS — Z01419 Encounter for gynecological examination (general) (routine) without abnormal findings: Secondary | ICD-10-CM | POA: Diagnosis not present

## 2020-08-17 ENCOUNTER — Ambulatory Visit: Payer: BC Managed Care – PPO | Admitting: Neurology

## 2020-09-11 DIAGNOSIS — Z23 Encounter for immunization: Secondary | ICD-10-CM | POA: Diagnosis not present

## 2020-10-02 ENCOUNTER — Encounter: Payer: Self-pay | Admitting: Gastroenterology

## 2020-10-02 ENCOUNTER — Ambulatory Visit (INDEPENDENT_AMBULATORY_CARE_PROVIDER_SITE_OTHER): Payer: BC Managed Care – PPO | Admitting: Gastroenterology

## 2020-10-02 VITALS — BP 98/60 | HR 83 | Ht 63.5 in | Wt 137.0 lb

## 2020-10-02 DIAGNOSIS — R1013 Epigastric pain: Secondary | ICD-10-CM | POA: Diagnosis not present

## 2020-10-02 DIAGNOSIS — R110 Nausea: Secondary | ICD-10-CM | POA: Diagnosis not present
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
## 2020-10-02 NOTE — Patient Instructions (Addendum)
I have recommended the following tests and treatments to evaluate your abdominal pain and nausea:  LABS:  I have recommended labs to check for pancreas and liver inflammation   IMAGING:  I have recommended a HIDA scan with CCK to evaluate the gallbladder   PRESCRIPTION MEDICATIONS:   I have recommended a trial of pantoprazole for acid blocking for 8 weeks to see if you feel better. Please let us know if you would like for me to send in the following medication(s) to your pharmacy:  Pantoprazole 40mg    ENDOSCOPY AND COLONOSCOPY:   It is time for colon cancer screening. Please call at any time to schedule a screening colonoscopy.   I have recommended an upper endoscopy to evaluate for gastritis, esophagitis, peptic ulcer disease, and H pylori. Please call our office when you are ready to proceed with scheduling your procedure.  I recommend avoiding all medications that can irritate the digestive tract including Goodies, BCs, and ibuprofen.  Please let me know if you would like to proceed with any of the above mentioned options.   If you are age 60 or younger, your body mass index should be between 19-25. Your Body mass index is 23.89 kg/m. If this is out of the aformentioned range listed, please consider follow up with your Primary Care Provider.   Thank you for trusting me with your gastrointestinal care!    Thornton Park, MD, MPH

## 2020-10-12 ENCOUNTER — Telehealth: Payer: Self-pay | Admitting: Gastroenterology

## 2020-10-12 DIAGNOSIS — R1013 Epigastric pain: Secondary | ICD-10-CM

## 2020-10-12 NOTE — Telephone Encounter (Signed)
Yes. May schedule HIDA and labs. She was going to discuss with her husband and call me back with her decision. Thanks.

## 2020-10-12 NOTE — Telephone Encounter (Signed)
At pt request, HIDA scan has been scheduled at [facility] for 11/06/20 @ 730am. Detailed instructions sent via My Chart with additional instructions pertaining to pt completing labs as well. Also called pt and provided all information verbally. Expressed understanding of all information provided. No further questions voiced at this time.

## 2020-10-12 NOTE — Telephone Encounter (Signed)
Patient called requesting to setup appointment for HIDA scan.

## 2020-10-12 NOTE — Telephone Encounter (Signed)
This pt declined recommendations at the time of her visit and we were waiting for her to decide how she wished to proceed. Please review and advise if you would prefer labs prior to scheduling HIDA.

## 2020-10-13 ENCOUNTER — Other Ambulatory Visit (INDEPENDENT_AMBULATORY_CARE_PROVIDER_SITE_OTHER): Payer: BC Managed Care – PPO

## 2020-10-13 DIAGNOSIS — R1013 Epigastric pain: Secondary | ICD-10-CM

## 2020-10-13 LAB — COMPREHENSIVE METABOLIC PANEL
ALT: 10 U/L (ref 0–35)
AST: 14 U/L (ref 0–37)
Albumin: 4 g/dL (ref 3.5–5.2)
Alkaline Phosphatase: 53 U/L (ref 39–117)
BUN: 11 mg/dL (ref 6–23)
CO2: 26 mEq/L (ref 19–32)
Calcium: 9.4 mg/dL (ref 8.4–10.5)
Chloride: 105 mEq/L (ref 96–112)
Creatinine, Ser: 0.76 mg/dL (ref 0.40–1.20)
GFR: 91.32 mL/min (ref >60.00)
Glucose, Bld: 85 mg/dL (ref 70–99)
Potassium: 4 mEq/L (ref 3.5–5.1)
Sodium: 139 mEq/L (ref 135–145)
Total Bilirubin: 0.5 mg/dL (ref 0.2–1.2)
Total Protein: 7.1 g/dL (ref 6.0–8.3)

## 2020-10-13 LAB — LIPASE: Lipase: 17 U/L (ref 11.0–59.0)

## 2020-10-26 ENCOUNTER — Ambulatory Visit: Payer: BC Managed Care – PPO | Admitting: Neurology

## 2020-11-06 ENCOUNTER — Other Ambulatory Visit: Payer: Self-pay

## 2020-11-06 ENCOUNTER — Ambulatory Visit (HOSPITAL_COMMUNITY)
Admission: RE | Admit: 2020-11-06 | Discharge: 2020-11-06 | Disposition: A | Discharge status: Home / Self Care | Payer: BC Managed Care – PPO | Source: Ambulatory Visit | Attending: Gastroenterology | Admitting: Gastroenterology

## 2020-11-06 DIAGNOSIS — R1013 Epigastric pain: Secondary | ICD-10-CM | POA: Diagnosis not present

## 2020-11-06 DIAGNOSIS — R110 Nausea: Secondary | ICD-10-CM | POA: Diagnosis not present

## 2020-11-06 MED ORDER — TECHNETIUM TC 99M MEBROFENIN IV KIT
4.9000 | PACK | Freq: Once | INTRAVENOUS | Status: AC | PRN
  Administered 2020-11-06: 4.9 via INTRAVENOUS

## 2020-11-07 ENCOUNTER — Telehealth: Payer: Self-pay | Admitting: Gastroenterology

## 2020-11-07 NOTE — Telephone Encounter (Signed)
Once Dr. Orvan Falconer reviews the results she will be notified.

## 2020-11-07 NOTE — Telephone Encounter (Signed)
Pt called inquiring about results of HIDA scan. She is aware that we will contact her once they are reviewed.

## 2020-11-13 ENCOUNTER — Telehealth: Payer: Self-pay | Admitting: Gastroenterology

## 2020-11-13 ENCOUNTER — Telehealth: Payer: Self-pay | Admitting: Family Medicine

## 2020-11-13 NOTE — Telephone Encounter (Signed)
Patient called back and stated that she was supposed to get a coloscopy but wanted to see if she should wait until after she finds out what is wrong with her stomach, please advise. CB is [phone]

## 2020-11-13 NOTE — Telephone Encounter (Signed)
Pt called inquiring about HIDA scan results. Pls call her.

## 2020-11-13 NOTE — Telephone Encounter (Signed)
See result note.  

## 2020-11-15 ENCOUNTER — Telehealth: Payer: Self-pay | Admitting: Gastroenterology

## 2020-11-15 NOTE — Telephone Encounter (Signed)
Pt called back to schedule EGD. Previsit scheduled 11/16/20@11am , EGD scheduled in the [facility] 11/20/20@10 :30am. Pt aware of appts.

## 2020-11-15 NOTE — Telephone Encounter (Signed)
Pt is requesting a call back from a nurse to discuss whether or not she needs an EGD.

## 2020-11-16 ENCOUNTER — Ambulatory Visit (AMBULATORY_SURGERY_CENTER): Payer: Self-pay

## 2020-11-16 ENCOUNTER — Other Ambulatory Visit: Payer: Self-pay

## 2020-11-16 ENCOUNTER — Encounter: Payer: Self-pay | Admitting: Gastroenterology

## 2020-11-16 VITALS — Ht 63.5 in | Wt 139.6 lb

## 2020-11-16 DIAGNOSIS — R1013 Epigastric pain: Secondary | ICD-10-CM

## 2020-11-16 DIAGNOSIS — R110 Nausea: Secondary | ICD-10-CM

## 2020-11-16 NOTE — Progress Notes (Signed)
No allergies to soy or egg Pt is not on blood thinners or diet pills Denies issues with sedation/intubation Denies atrial flutter/fib Denies constipation   Emmi instructions given to pt  Pt is aware of Covid safety and care partner requirements.  

## 2020-11-16 NOTE — Telephone Encounter (Signed)
At age 50 she needs a screening colonoscopy, but I see Dr. Orvan Falconer wants to do an upper endoscopy on her. Have the patient ask Dr. Orvan Falconer if she can schedule her to have both of these done at the same time

## 2020-11-16 NOTE — Telephone Encounter (Signed)
Patient informed of the message below.  States initially she wanted to know what to do as her OB/GYN had scheduled a colonoscopy with Dr Loreta Ave and she has the endoscopy scheduled with Dr Orvan Falconer.  Patient stated this has resolved what she needs to do as she is going to cancel the appt with Dr Loreta Ave and ask Dr Orvan Falconer to perform both tests.

## 2020-11-19 ENCOUNTER — Encounter: Payer: Self-pay | Admitting: Certified Registered Nurse Anesthetist

## 2020-11-20 ENCOUNTER — Other Ambulatory Visit: Payer: Self-pay

## 2020-11-20 ENCOUNTER — Encounter: Payer: Self-pay | Admitting: Gastroenterology

## 2020-11-20 ENCOUNTER — Ambulatory Visit (AMBULATORY_SURGERY_CENTER): Payer: BC Managed Care – PPO | Admitting: Gastroenterology

## 2020-11-20 VITALS — BP 115/57 | HR 60 | Temp 98.3°F | Resp 16 | Ht 63.5 in | Wt 139.6 lb

## 2020-11-20 DIAGNOSIS — K219 Gastro-esophageal reflux disease without esophagitis: Secondary | ICD-10-CM

## 2020-11-20 DIAGNOSIS — K319 Disease of stomach and duodenum, unspecified: Secondary | ICD-10-CM | POA: Diagnosis not present

## 2020-11-20 DIAGNOSIS — K2970 Gastritis, unspecified, without bleeding: Secondary | ICD-10-CM

## 2020-11-20 DIAGNOSIS — K3189 Other diseases of stomach and duodenum: Secondary | ICD-10-CM | POA: Diagnosis not present

## 2020-11-20 DIAGNOSIS — R1013 Epigastric pain: Secondary | ICD-10-CM

## 2020-11-20 HISTORY — PX: UPPER GASTROINTESTINAL ENDOSCOPY: SHX188

## 2020-11-20 MED ORDER — SODIUM CHLORIDE 0.9 % IV SOLN: 500.0000 mL | Freq: Once | INTRAVENOUS | Status: DC

## 2020-11-20 NOTE — Progress Notes (Signed)
Pt's states no medical or surgical changes since previsit or office visit.   Vs by CW in adm 

## 2020-11-20 NOTE — Patient Instructions (Signed)
YOU HAD AN ENDOSCOPIC PROCEDURE TODAY AT THE [facility]:   Refer to the procedure report that was given to you for any specific questions about what was found during the examination.  If the procedure report does not answer your questions, please call your gastroenterologist to clarify.  If you requested that your care partner not be given the details of your procedure findings, then the procedure report has been included in a sealed envelope for you to review at your convenience later.  YOU SHOULD EXPECT: Some feelings of bloating in the abdomen. Passage of more gas than usual.  Walking can help get rid of the air that was put into your GI tract during the procedure and reduce the bloating. If you had a lower endoscopy (such as a colonoscopy or flexible sigmoidoscopy) you may notice spotting of blood in your stool or on the toilet paper. If you underwent a bowel prep for your procedure, you may not have a normal bowel movement for a few days.  Please Note:  You might notice some irritation and congestion in your nose or some drainage.  This is from the oxygen used during your procedure.  There is no need for concern and it should clear up in a day or so.  SYMPTOMS TO REPORT IMMEDIATELY:   Following lower endoscopy (colonoscopy or flexible sigmoidoscopy):  Excessive amounts of blood in the stool  Significant tenderness or worsening of abdominal pains  Swelling of the abdomen that is new, acute  Fever of 100F or higher   Following upper endoscopy (EGD)  Vomiting of blood or coffee ground material  New chest pain or pain under the shoulder blades  Painful or persistently difficult swallowing  New shortness of breath  Fever of 100F or higher  Black, tarry-looking stools  For urgent or emergent issues, a gastroenterologist can be reached at any hour by calling [phone]. Do not use MyChart messaging for urgent concerns.    DIET:  We do recommend a small meal at first, but  then you may proceed to your regular diet.  Drink plenty of fluids but you should avoid alcoholic beverages for 24 hours.  ACTIVITY:  You should plan to take it easy for the rest of today and you should NOT DRIVE or use heavy machinery until tomorrow (because of the sedation medicines used during the test).    FOLLOW UP: Our staff will call the number listed on your records 48-72 hours following your procedure to check on you and address any questions or concerns that you may have regarding the information given to you following your procedure. If we do not reach you, we will leave a message.  We will attempt to reach you two times.  During this call, we will ask if you have developed any symptoms of COVID 19. If you develop any symptoms (ie: fever, flu-like symptoms, shortness of breath, cough etc.) before then, please call [phone].  If you test positive for Covid 19 in the 2 weeks post procedure, please call and report this information to us.    If any biopsies were taken you will be contacted by phone or by letter within the next 1-3 weeks.  Please call us at [phone] if you have not heard about the biopsies in 3 weeks.    SIGNATURES/CONFIDENTIALITY: You and/or your care partner have signed paperwork which will be entered into your electronic medical record.  These signatures attest to the fact that that the information above on   your After Visit Summary has been reviewed and is understood.  Full responsibility of the confidentiality of this discharge information lies with you and/or your care-partner. 

## 2020-11-20 NOTE — Progress Notes (Signed)
Called to room to assist during endoscopic procedure.  Patient ID and intended procedure confirmed with present staff. Received instructions for my participation in the procedure from the performing physician.  

## 2020-11-20 NOTE — Progress Notes (Signed)
Report given to PACU, vss 

## 2020-11-20 NOTE — Op Note (Signed)
[name] Patient Name: Susan Foster Procedure Date: 11/20/2020 10:34 AM MRN: [id number] Endoscopist: Thornton Park MD, MD Age: 50 Referring MD:  Date of Birth: [birth year] Gender: Female Account #: [id number] Procedure:                Upper GI endoscopy Indications:              Epigastric abdominal pain, Nausea                           Normal HIDA scan Medicines:                Monitored Anesthesia Care Procedure:                Pre-Anesthesia Assessment:                           - Prior to the procedure, a History and Physical                            was performed, and patient medications and                            allergies were reviewed. The patient's tolerance of                            previous anesthesia was also reviewed. The risks                            and benefits of the procedure and the sedation                            options and risks were discussed with the patient.                            All questions were answered, and informed consent                            was obtained. Prior Anticoagulants: The patient has                            taken no previous anticoagulant or antiplatelet                            agents. ASA Grade Assessment: II - A patient with                            mild systemic disease. After reviewing the risks                            and benefits, the patient was deemed in                            satisfactory condition to undergo the procedure.  After obtaining informed consent, the endoscope was                            passed under direct vision. Throughout the                            procedure, the patient's blood pressure, pulse, and                            oxygen saturations were monitored continuously. The                            Endoscope was introduced through the mouth, and                            advanced to the third part of duodenum. The upper                             GI endoscopy was accomplished without difficulty.                            The patient tolerated the procedure well. Scope In: Scope Out: Findings:                 The Z-line was irregular, slightly heaped and                            located 38 cm from the incisors. Biopsies were                            taken with a cold forceps for histology. Estimated                            blood loss was minimal.                           Patchy minimal inflammation characterized by                            erythema, friability and granularity was found in                            the gastric fundus and in the gastric body.                            Biopsies were taken from the antum, body, and                            fundus with a cold forceps for histology. Estimated                            blood loss was minimal.  The examined duodenum was normal. Biopsies were                            taken with a cold forceps for histology. Estimated                            blood loss was minimal. Complications:            No immediate complications. Estimated blood loss:                            Minimal. Estimated Blood Loss:     Estimated blood loss was minimal. Impression:               - Z-line irregular, 38 cm from the incisors.                            Biopsied.                           - Gastritis. Biopsied.                           - Normal examined duodenum. Biopsied. Recommendation:           - Patient has a contact number available for                            emergencies. The signs and symptoms of potential                            delayed complications were discussed with the                            patient. Return to normal activities tomorrow.                            Written discharge instructions were provided to the                            patient.                           - Resume previous  diet.                           - Continue present medications.                           - No aspirin, ibuprofen, naproxen, or other                            non-steroidal anti-inflammatory drugs.                           - Await pathology results. Thornton Park MD, MD 11/20/2020 11:00:32 AM This report has been signed electronically.

## 2020-11-22 ENCOUNTER — Telehealth: Payer: Self-pay

## 2020-11-22 NOTE — Telephone Encounter (Signed)
  Follow up Call-  Call back number 11/20/2020  Post procedure Call Back phone  # [phone]  Permission to leave phone message Yes  Some recent data might be hidden     Patient questions:  Do you have a fever, pain , or abdominal swelling? No. Pain Score  0 *  Have you tolerated food without any problems? Yes.    Have you been able to return to your normal activities? Yes.    Do you have any questions about your discharge instructions: Diet   No. Medications  No. Follow up visit  No.  Do you have questions or concerns about your Care? No.  Actions: * If pain score is 4 or above: No action needed, pain <4.  1. Have you developed a fever since your procedure? no  2.   Have you had an respiratory symptoms (SOB or cough) since your procedure? no  3.   Have you tested positive for COVID 19 since your procedure no  4.   Have you had any family members/close contacts diagnosed with the COVID 19 since your procedure?  no   If yes to any of these questions please route to Joylene John, RN and Joella Prince, RN

## 2020-11-29 ENCOUNTER — Telehealth: Payer: Self-pay | Admitting: Gastroenterology

## 2020-11-29 ENCOUNTER — Other Ambulatory Visit: Payer: Self-pay

## 2020-11-29 ENCOUNTER — Telehealth: Payer: Self-pay

## 2020-11-29 MED ORDER — PANTOPRAZOLE SODIUM 40 MG PO TBEC: 40.0000 mg | DELAYED_RELEASE_TABLET | Freq: Two times a day (BID) | ORAL | 3 refills | Status: DC

## 2020-11-29 NOTE — Telephone Encounter (Signed)
Patient is returning your call regarding path results also stated she has additional questions and is having issues with MyChart

## 2020-11-29 NOTE — Telephone Encounter (Signed)
See result note.  

## 2020-11-29 NOTE — Telephone Encounter (Signed)
PRIOR AUTHORIZATION  PA initiation date: 11/29/20  Medication: Pantoprazole Insurance Company: [employer] completed electronically through [employer] My Meds: Yes  Will await insurance response re: approval/denial.  Archie Balboa (KeyNoralyn Pick)  Your information has been submitted to [place]. [employer] [place] will review the request and notify you of the determination decision directly, typically within 72 hours of receiving all information.  You will also receive your request decision electronically. To check for an update later, open this request again from your dashboard.  If [employer] has not responded within the specified timeframe or if you have any questions about your PA submission, contact [place] [employer] directly at [phone].  Archie Balboa Key: [id number] - Rx #: [id number] Need help? Call us at [phone] Status Additional Information Required Drug Pantoprazole Sodium 40MG  dr tablets Form Blue Cross [place] [employer] Electronic Request Form (CB) Original Claim Info 75 DRUG REQUIRES PRIOR AUTHORIZATION

## 2020-11-30 NOTE — Telephone Encounter (Signed)
Followed up on status of PA. Still remains pending at this time.

## 2020-12-01 NOTE — Telephone Encounter (Signed)
APPROVAL  Medication: Pantoprazole Insurance Company: [employer] PA response: APPROVED Approval dates: 11/29/20 through 11/28/21  Documents have been labeled and placed in scan file for HIM and for our future reference.  Archie Balboa (KeyNoralyn Pick)  This request has received a Favorable outcome from [place].  Please keep in mind this is not a guarantee of payment. Eligibility and Benefit determinations will be made at the time of service.  Please note any additional information provided by [facility] at the bottom of the screen.  Archie Balboa Key: [id number] - Rx #: [id number] Need help? Call us at [phone] Outcome Approvedtoday Effective from 11/29/2020 through 11/28/2021. Drug Pantoprazole Sodium 40MG  dr tablets Form Blue Cross [place] Research officer, political party Form (CB) Original Claim Info 75 DRUG REQUIRES PRIOR AUTHORIZATION

## 2020-12-29 ENCOUNTER — Ambulatory Visit: Payer: BC Managed Care – PPO | Admitting: Gastroenterology

## 2021-01-18 ENCOUNTER — Encounter: Payer: Self-pay | Admitting: Gastroenterology

## 2021-01-18 ENCOUNTER — Ambulatory Visit (INDEPENDENT_AMBULATORY_CARE_PROVIDER_SITE_OTHER): Payer: BC Managed Care – PPO | Admitting: Gastroenterology

## 2021-01-18 VITALS — BP 98/68 | HR 78 | Ht 63.5 in | Wt 139.5 lb

## 2021-01-18 DIAGNOSIS — R110 Nausea: Secondary | ICD-10-CM

## 2021-01-18 DIAGNOSIS — R1013 Epigastric pain: Secondary | ICD-10-CM

## 2021-01-18 DIAGNOSIS — K2970 Gastritis, unspecified, without bleeding: Secondary | ICD-10-CM
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
## 2021-01-18 NOTE — Patient Instructions (Addendum)
It was a pleasure to see you today. Based on our discussion, I am providing you with my recommendations below:  RECOMMENDATION(S):   Modifying diet and lifestyle remains the foundation for managing your symptoms. Although, stress may be playing a part in your GI symptoms.  The following strategies may help your symptoms from recurring as your biopsies of your esophagus and stomach showing inflammation and injury from acid exposure.   Eat smaller meals. A large meal remains in the stomach for several hours, increasing the chances for gastroesophageal reflux. Try distributing your daily food intake over three, four, or five smaller meals.  Relax when you eat. Stress increases the production of stomach acid, so make meals a pleasant, relaxing experience. Sit down. Eat slowly. Chew completely. Play soothing music.  Relax between meals. Relaxation therapies such as deep breathing, meditation, massage, tai chi, or yoga may help prevent and relieve heartburn.   Remain upright after eating. You should maintain postures that reduce the risk for reflux for at least three hours after eating. For example, don't bend over or strain to lift heavy objects.  Avoid eating within three hours of going to bed. Lying down after eating will increase chances of reflux.  Lose weight. Excess pounds increase pressure on the stomach and can push acid into the esophagus.  Loosen up. Avoid tight belts, waistbands, and other clothing that puts pressure on your stomach.  Avoid foods that burn. Abstain from food or drink that increases gastric acid secretion, decreases the valve at the bottom of the esophagus, or slows the emptying of the stomach. Known offenders include high-fat foods, spicy dishes, tomatoes and tomato products, citrus fruits, garlic, onions, milk, carbonated drinks, coffee (including decaf), tea, chocolate, mints, and alcohol.  Avoid medications that can predispose you to reflux including aspirin and  other NSAIDs, oral contraceptives, hormone therapy drugs, and certain antidepressants.  Sleep at an angle. If you're bothered by nighttime heartburn, place a wedge (available in medical supply stores or a wedge pillow through [employer]) under your upper body. But don't elevate your head with extra pillows. That makes reflux worse by bending you at the waist and compressing your stomach. You might also try sleeping on your left side, as studies have shown this can help-perhaps because the stomach is on the left side of the body, so lying on your left positions most of the stomach below the bottom of the esophagus.  FOLLOW UP: If your symptoms return, please start taking the pantoprazole as a way to treatment the acid-peptic process that is occurring.   Call me when you want to have a colonoscopy for colon cancer screening.   BMI:  . If you are age 51 or younger, your body mass index should be between 19-25. Your Body mass index is 24.32 kg/m. If this is out of the aformentioned range listed, please consider follow up with your Primary Care Provider.   Thank you for trusting me with your gastrointestinal care!    Kimberly Beavers, MD, MPH  

## 2021-02-27 IMAGING — US ULTRASOUND ABDOMEN COMPLETE
1 series · 14 of 25 positions shown · non-contrast
Comparison: Ultrasound 12/28/2013.

CLINICAL DATA: Right upper quadrant pain.

EXAM:
ABDOMEN ULTRASOUND COMPLETE

[Series 1: ultrasound abdomen complete · 0.22mm/px · 14 of 99 slices shown]
[im 1/99]
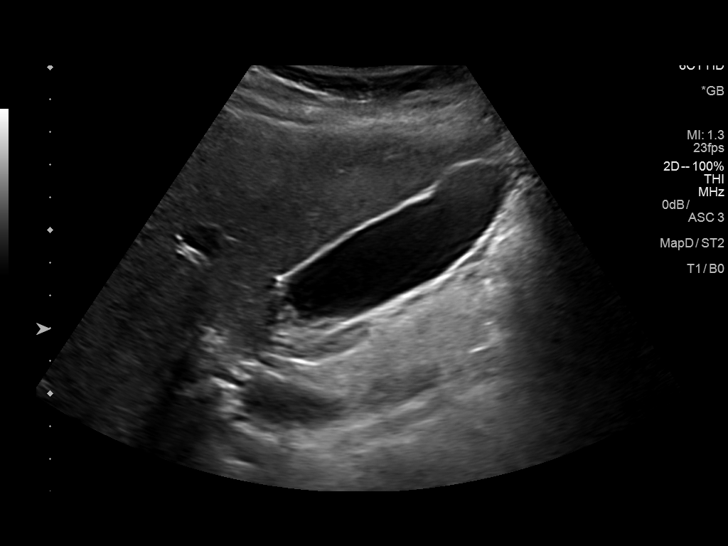
[im 9/99]
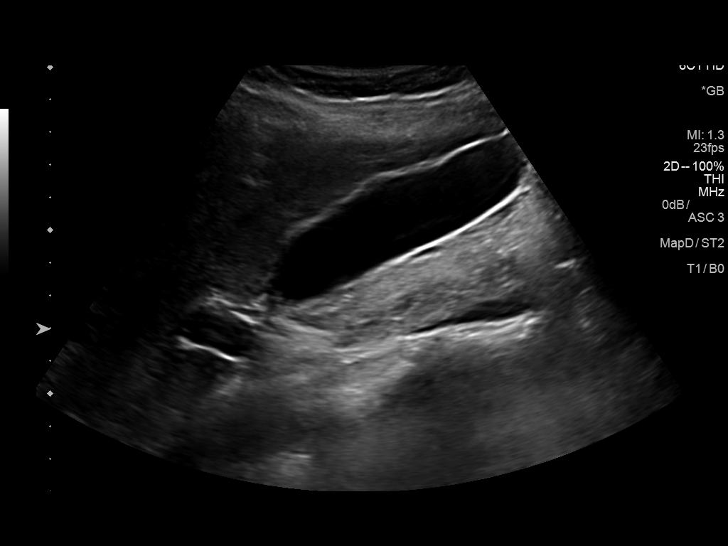
[im 17/99]
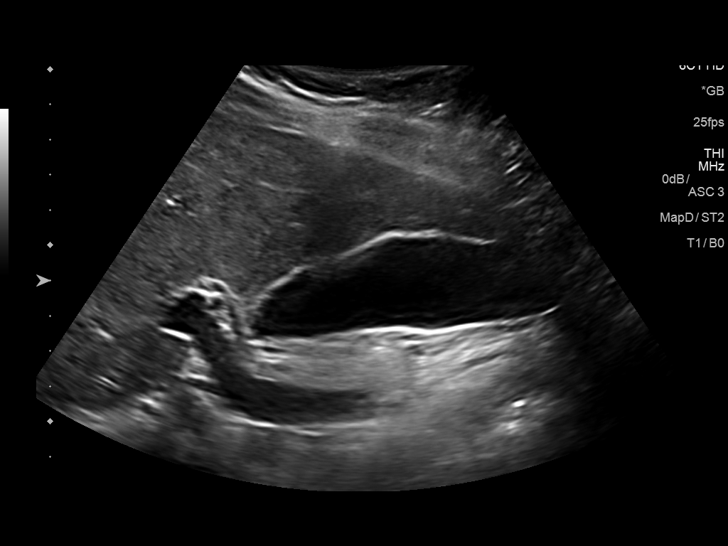
[im 25/99]
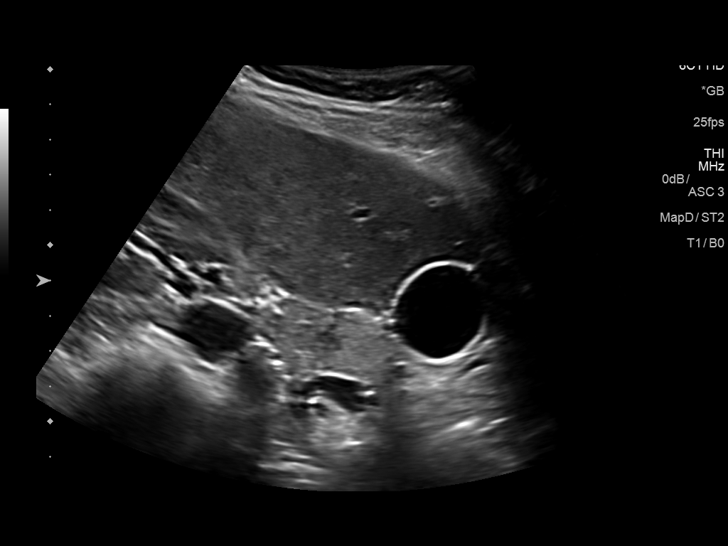
[im 33/99]
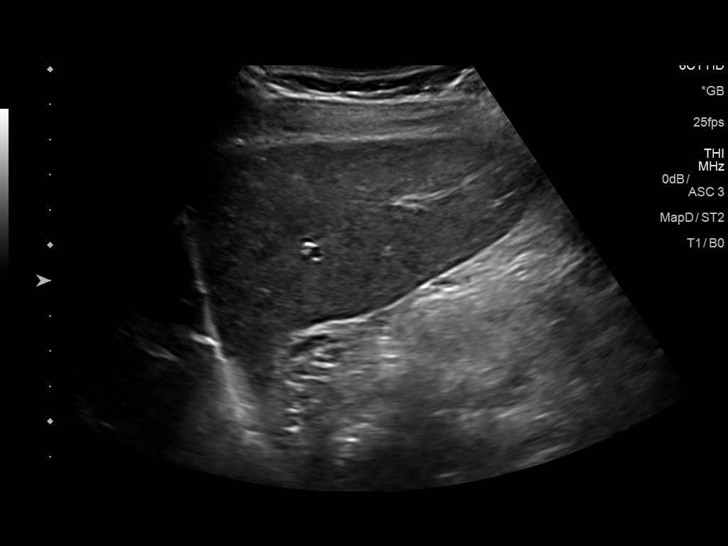
[im 37/99]
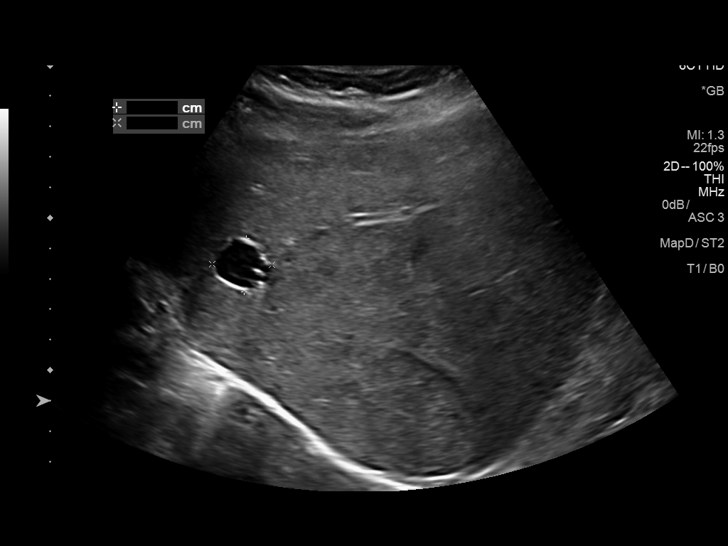
[im 45/99]
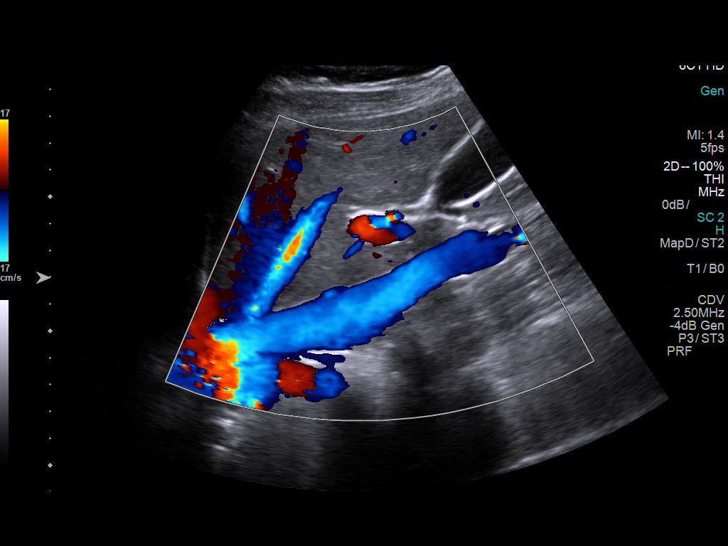
[im 54/99]
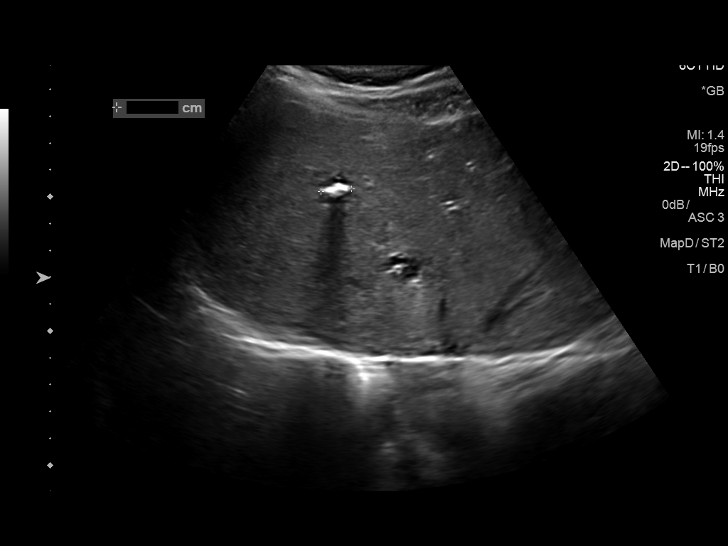
[im 62/99]
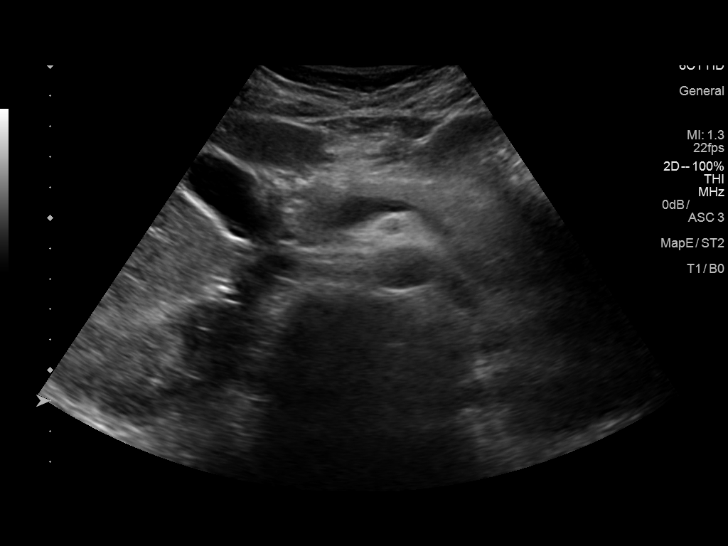
[im 66/99]
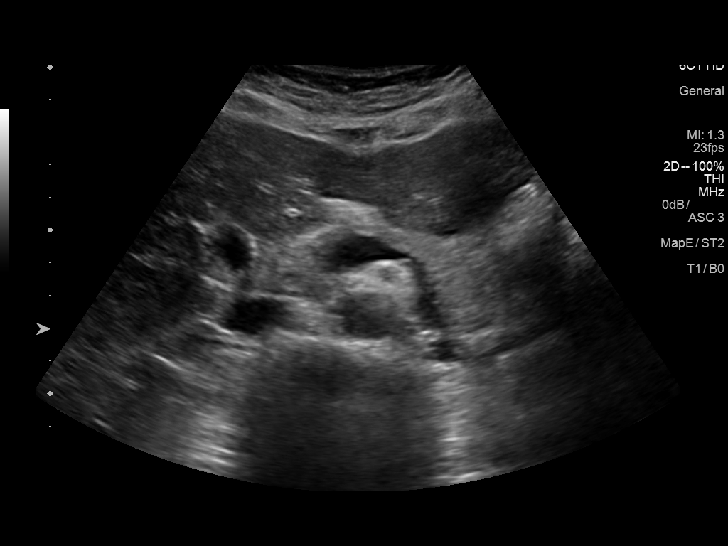
[im 74/99]
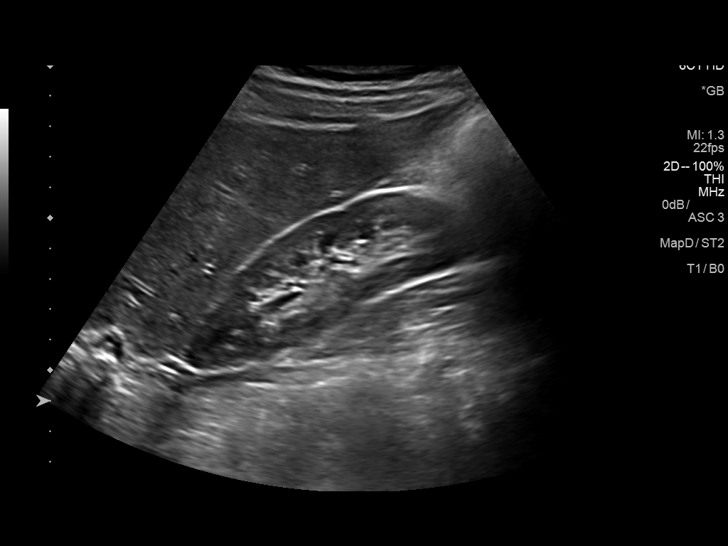
[im 82/99]
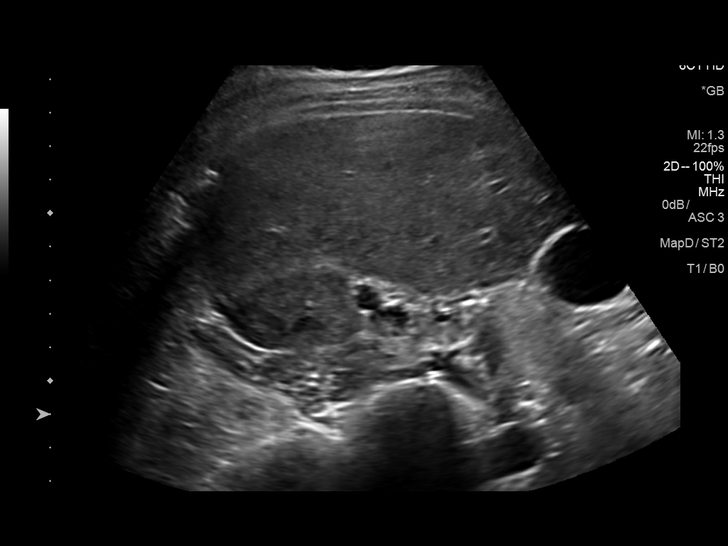
[im 90/99]
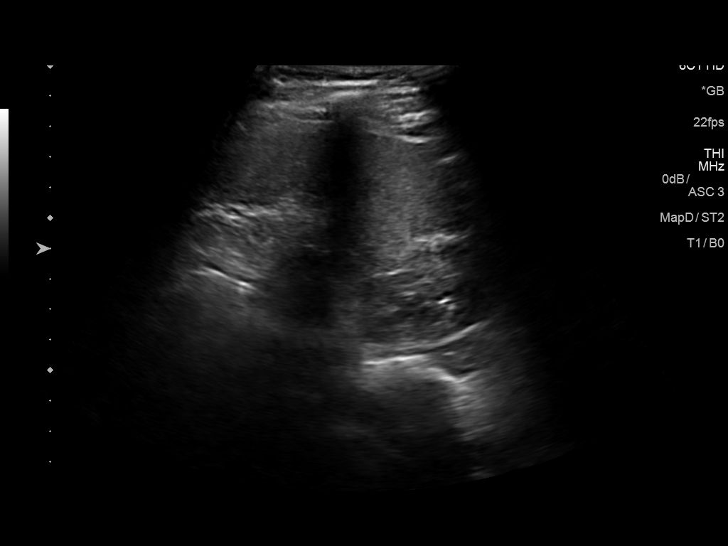
[im 99/99]
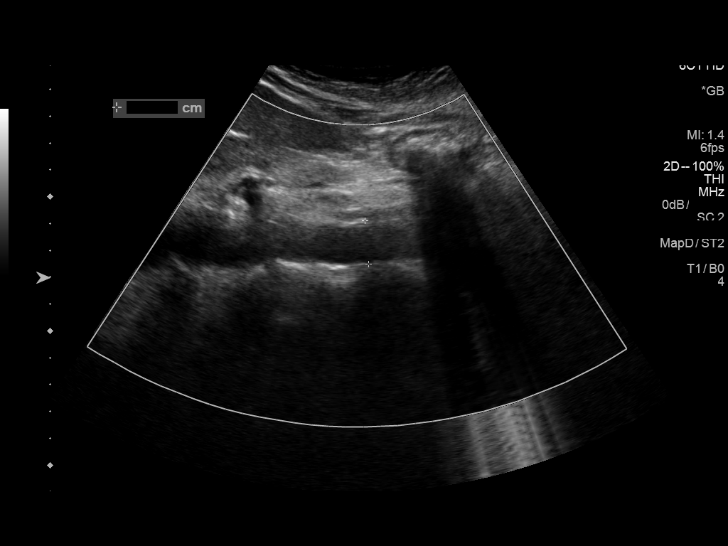

[14 of 25 positions shown; findings below may reference images not displayed]

FINDINGS: Gallbladder: No gallstones or gallbladder wall thickening.
Questionable positive ultrasound Murphy sign, this is nonspecific.

Common bile duct: Diameter: 2.6 mm

Liver: 2.0 cm benign-appearing cyst right hepatic lobe. No
significant hepatic abnormality identified. Portal vein is patent on
color Doppler imaging with normal direction of blood flow towards
the liver.

IVC: No abnormality visualized.

Pancreas: Visualized portion unremarkable.

Spleen: Size and appearance within normal limits.

Right Kidney: Length: 10.8 cm. Echogenicity within normal limits. No
mass or hydronephrosis visualized.

Left Kidney: Length: 11.4 cm. Echogenicity within normal limits. No
mass or hydronephrosis visualized.

Abdominal aorta: No aneurysm visualized.

Other findings: None.
IMPRESSION: 1. No acute or focal abnormality. Patient did experience pain right
upper quadrant on exam. No gallstones, gallbladder wall thickening,
or biliary distention noted.

2.  Benign-appearing 2.0 cm cyst right hepatic lobe.

## 2021-05-15 ENCOUNTER — Encounter: Payer: Self-pay | Admitting: Family Medicine

## 2021-05-15 ENCOUNTER — Telehealth (INDEPENDENT_AMBULATORY_CARE_PROVIDER_SITE_OTHER): Payer: BC Managed Care – PPO | Admitting: Family Medicine

## 2021-05-15 VITALS — Wt 139.0 lb

## 2021-05-15 DIAGNOSIS — J019 Acute sinusitis, unspecified: Secondary | ICD-10-CM | POA: Diagnosis not present

## 2021-05-15 MED ORDER — AZITHROMYCIN 250 MG PO TABS: ORAL_TABLET | ORAL | 0 refills | Status: DC
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
## 2021-07-23 ENCOUNTER — Encounter: Payer: Self-pay | Admitting: Family Medicine

## 2021-07-23 MED ORDER — HYDROCODONE BIT-HOMATROP MBR 5-1.5 MG/5ML PO SOLN: 5.0000 mL | ORAL | 0 refills | Status: DC | PRN

## 2021-07-23 NOTE — Telephone Encounter (Signed)
Done

## 2021-08-02 ENCOUNTER — Encounter: Payer: Self-pay | Admitting: Gastroenterology

## 2021-08-14 DIAGNOSIS — Z6824 Body mass index (BMI) 24.0-24.9, adult: Secondary | ICD-10-CM | POA: Diagnosis not present

## 2021-08-14 DIAGNOSIS — Z01419 Encounter for gynecological examination (general) (routine) without abnormal findings: Secondary | ICD-10-CM | POA: Diagnosis not present

## 2021-08-14 DIAGNOSIS — Z1231 Encounter for screening mammogram for malignant neoplasm of breast: Secondary | ICD-10-CM | POA: Diagnosis not present

## 2021-09-11 ENCOUNTER — Telehealth: Payer: Self-pay | Admitting: Family Medicine

## 2021-09-11 NOTE — Telephone Encounter (Signed)
Dr. Sarajane Jews patient.  See 07/23/21, patient message. Please advise

## 2021-09-11 NOTE — Telephone Encounter (Signed)
Patient called because she is still having slight congestion, wants to know if there is anything she can buy over the counter help. She had flu like symptoms a couple weeks ago and while everything else has went away there is lingering congestion on occasion.   Good callback number is [phone]    Please advise

## 2021-09-12 NOTE — Telephone Encounter (Signed)
Called and spoke with patient about message.   

## 2021-09-18 ENCOUNTER — Other Ambulatory Visit: Payer: Self-pay

## 2021-09-18 ENCOUNTER — Encounter: Payer: Self-pay | Admitting: Gastroenterology

## 2021-09-18 ENCOUNTER — Ambulatory Visit (AMBULATORY_SURGERY_CENTER): Payer: BC Managed Care – PPO | Admitting: *Deleted

## 2021-09-18 VITALS — Ht 63.5 in | Wt 139.0 lb

## 2021-09-18 DIAGNOSIS — Z1211 Encounter for screening for malignant neoplasm of colon: Secondary | ICD-10-CM

## 2021-09-18 MED ORDER — NA SULFATE-K SULFATE-MG SULF 17.5-3.13-1.6 GM/177ML PO SOLN: 1.0000 | ORAL | 0 refills | Status: DC

## 2021-09-18 NOTE — Progress Notes (Signed)

## 2021-09-20 DIAGNOSIS — H0100A Unspecified blepharitis right eye, upper and lower eyelids: Secondary | ICD-10-CM | POA: Diagnosis not present

## 2021-09-26 ENCOUNTER — Other Ambulatory Visit: Payer: Self-pay

## 2021-09-26 ENCOUNTER — Ambulatory Visit (AMBULATORY_SURGERY_CENTER): Payer: BC Managed Care – PPO | Admitting: Gastroenterology

## 2021-09-26 ENCOUNTER — Encounter: Payer: Self-pay | Admitting: Gastroenterology

## 2021-09-26 VITALS — BP 119/65 | HR 2 | Temp 97.3°F | Resp 17 | Ht 63.5 in | Wt 139.0 lb

## 2021-09-26 DIAGNOSIS — Z1211 Encounter for screening for malignant neoplasm of colon: Secondary | ICD-10-CM

## 2021-09-26 MED ORDER — SODIUM CHLORIDE 0.9 % IV SOLN: 500.0000 mL | Freq: Once | INTRAVENOUS | Status: DC

## 2021-09-26 NOTE — Progress Notes (Signed)
Report to PACU, RN, vss, BBS= Clear.  

## 2021-09-26 NOTE — Progress Notes (Signed)
VS completed by CW.   Pt's states no medical or surgical changes since previsit or office visit.  
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
## 2021-09-26 NOTE — Op Note (Addendum)
[facility] Patient Name: Susan Foster Procedure Date: 09/26/2021 9:44 AM MRN: [id number] Endoscopist: Thornton Park MD, MD Age: 50 Referring MD:  Date of Birth: [birth year] Gender: Female Account #: [id number] Procedure:                Colonoscopy Indications:              Screening for colorectal malignant neoplasm, This                            is the patient's first colonoscopy                           No known family history of colon cancer or polyps Medicines:                Monitored Anesthesia Care Procedure:                Pre-Anesthesia Assessment:                           - Prior to the procedure, a History and Physical                            was performed, and patient medications and                            allergies were reviewed. The patient's tolerance of                            previous anesthesia was also reviewed. The risks                            and benefits of the procedure and the sedation                            options and risks were discussed with the patient.                            All questions were answered, and informed consent                            was obtained. Prior Anticoagulants: The patient has                            taken no previous anticoagulant or antiplatelet                            agents. ASA Grade Assessment: II - A patient with                            mild systemic disease. After reviewing the risks                            and benefits, the patient was deemed in  satisfactory condition to undergo the procedure.                           After obtaining informed consent, the colonoscope                            was passed under direct vision. Throughout the                            procedure, the patient's blood pressure, pulse, and                            oxygen saturations were monitored continuously. The                            CF [id number] #[id number]  was introduced through the anus                            and advanced to the 3 cm into the ileum. A second                            forward view of the right colon was performed. The                            colonoscopy was performed without difficulty. The                            patient tolerated the procedure well. The quality                            of the bowel preparation was excellent. The                            terminal ileum, ileocecal valve, appendiceal                            orifice, and rectum were photographed. Scope In: 10:29:14 AM Scope Out: 10:44:56 AM Scope Withdrawal Time: 0 hours 11 minutes 57 seconds  Total Procedure Duration: 0 hours 15 minutes 42 seconds  Findings:                 The perianal and digital rectal examinations were                            normal.                           The entire examined colon appeared normal on direct                            and retroflexion views. Complications:            No immediate complications. Estimated Blood Loss:     Estimated blood loss: none. Impression:               -  The entire examined colon is normal on direct and                            retroflexion views.                           - No specimens collected. Recommendation:           - Patient has a contact number available for                            emergencies. The signs and symptoms of potential                            delayed complications were discussed with the                            patient. Return to normal activities tomorrow.                            Written discharge instructions were provided to the                            patient.                           - Resume previous diet.                           - Continue present medications.                           - Repeat colonoscopy in 10 years for surveillance,                            earlier with new symptoms.                           - Emerging  evidence supports eating a diet of                            fruits, vegetables, grains, calcium, and yogurt                            while reducing red meat and alcohol may reduce the                            risk of colon cancer.                           - Thank you for allowing me to be involved in your                            colon cancer prevention. Thornton Park MD, MD 09/26/2021 10:49:14 AM This report has been signed electronically.

## 2021-09-26 NOTE — Patient Instructions (Signed)
Read all of the handouts given to you by your recovery room nurse.  YOU HAD AN ENDOSCOPIC PROCEDURE TODAY AT [facility]:   Refer to the procedure report that was given to you for any specific questions about what was found during the examination.  If the procedure report does not answer your questions, please call your gastroenterologist to clarify.  If you requested that your care partner not be given the details of your procedure findings, then the procedure report has been included in a sealed envelope for you to review at your convenience later.  YOU SHOULD EXPECT: Some feelings of bloating in the abdomen. Passage of more gas than usual.  Walking can help get rid of the air that was put into your GI tract during the procedure and reduce the bloating. If you had a lower endoscopy (such as a colonoscopy or flexible sigmoidoscopy) you may notice spotting of blood in your stool or on the toilet paper. If you underwent a bowel prep for your procedure, you may not have a normal bowel movement for a few days.  Please Note:  You might notice some irritation and congestion in your nose or some drainage.  This is from the oxygen used during your procedure.  There is no need for concern and it should clear up in a day or so.  SYMPTOMS TO REPORT IMMEDIATELY:  Following lower endoscopy (colonoscopy or flexible sigmoidoscopy):  Excessive amounts of blood in the stool  Significant tenderness or worsening of abdominal pains  Swelling of the abdomen that is new, acute  Fever of 100F or higher   For urgent or emergent issues, a gastroenterologist can be reached at any hour by calling [phone]. Do not use MyChart messaging for urgent concerns.    DIET:  We do recommend a small meal at first, but then you may proceed to your regular diet.  Drink plenty of fluids but you should avoid alcoholic beverages for 24 hours.  ACTIVITY:  You should plan to take it easy for the rest of today and  you should NOT DRIVE or use heavy machinery until tomorrow (because of the sedation medicines used during the test).    FOLLOW UP: Our staff will call the number listed on your records 48-72 hours following your procedure to check on you and address any questions or concerns that you may have regarding the information given to you following your procedure. If we do not reach you, we will leave a message.  We will attempt to reach you two times.  During this call, we will ask if you have developed any symptoms of COVID 19. If you develop any symptoms (ie: fever, flu-like symptoms, shortness of breath, cough etc.) before then, please call [phone].  If you test positive for Covid 19 in the 2 weeks post procedure, please call and report this information to [place].     SIGNATURES/CONFIDENTIALITY: You and/or your care partner have signed paperwork which will be entered into your electronic medical record.  These signatures attest to the fact that that the information above on your After Visit Summary has been reviewed and is understood.  Full responsibility of the confidentiality of this discharge information lies with you and/or your care-partner.

## 2021-09-28 ENCOUNTER — Telehealth: Payer: Self-pay | Admitting: *Deleted

## 2021-09-28 NOTE — Telephone Encounter (Signed)
  Follow up Call-  Call back number 09/26/2021 11/20/2020  Post procedure Call Back phone  # [phone]  Permission to leave phone message Yes Yes  Some recent data might be hidden     Patient questions:  Do you have a fever, pain , or abdominal swelling? No. Pain Score  0 *  Have you tolerated food without any problems? Yes.    Have you been able to return to your normal activities? Yes.    Do you have any questions about your discharge instructions: Diet   No. Medications  No. Follow up visit  No.  Do you have questions or concerns about your Care? No.  Actions: * If pain score is 4 or above: No action needed, pain <4.  Have you developed a fever since your procedure? no  2.   Have you had an respiratory symptoms (SOB or cough) since your procedure? no  3.   Have you tested positive for COVID 19 since your procedure no  4.   Have you had any family members/close contacts diagnosed with the COVID 19 since your procedure?  no   If yes to any of these questions please route to Joylene John, RN and Joella Prince, RN

## 2021-10-17 ENCOUNTER — Telehealth: Payer: Self-pay

## 2021-10-17 NOTE — Telephone Encounter (Signed)
Patient called asking if Dr. Sarajane Jews would give her a call  at  end of the day after all patient have been seen

## 2021-10-18 NOTE — Telephone Encounter (Signed)
Please advise 

## 2021-10-18 NOTE — Telephone Encounter (Signed)
Please get more information about the issue

## 2021-10-19 ENCOUNTER — Telehealth: Payer: Self-pay

## 2021-10-19 NOTE — Telephone Encounter (Signed)
---  Caller states she has a cough with congestion in her upper chest that started on Tuesday, has taken Mucinex sinus max on Wednesday and it helps suppress the cough, is coughing in order to cough up mucus and it is yellow/green in color , drinking fluids and voiding, denies, fever, body aches, CP and other symptoms,  10/19/2021 10:51:52 AM [place], RN, [employer]

## 2021-10-21 ENCOUNTER — Ambulatory Visit
Admission: RE | Admit: 2021-10-21 | Discharge: 2021-10-21 | Disposition: A | Discharge status: Home / Self Care | Payer: BC Managed Care – PPO | Source: Ambulatory Visit | Attending: Urgent Care | Admitting: Urgent Care

## 2021-10-21 ENCOUNTER — Other Ambulatory Visit: Payer: Self-pay

## 2021-10-21 VITALS — BP 109/70 | HR 80 | Temp 98.0°F | Resp 18

## 2021-10-21 DIAGNOSIS — J45909 Unspecified asthma, uncomplicated: Secondary | ICD-10-CM

## 2021-10-21 DIAGNOSIS — J9801 Acute bronchospasm: Secondary | ICD-10-CM

## 2021-10-21 DIAGNOSIS — R051 Acute cough: Secondary | ICD-10-CM

## 2021-10-21 DIAGNOSIS — H65191 Other acute nonsuppurative otitis media, right ear: Secondary | ICD-10-CM

## 2021-10-21 MED ORDER — PROMETHAZINE-DM 6.25-15 MG/5ML PO SYRP: 5.0000 mL | ORAL_SOLUTION | Freq: Every evening | ORAL | 0 refills | Status: DC | PRN

## 2021-10-21 MED ORDER — PREDNISONE 20 MG PO TABS: ORAL_TABLET | ORAL | 0 refills | Status: DC

## 2021-10-21 MED ORDER — CEFDINIR 300 MG PO CAPS: 300.0000 mg | ORAL_CAPSULE | Freq: Two times a day (BID) | ORAL | 0 refills | Status: DC
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
## 2021-10-21 NOTE — ED Triage Notes (Signed)
Productive cough with brown sputum since Tuesday.  Home covid test negative.  Had flu back in October, covid in [place].

## 2021-10-22 NOTE — Telephone Encounter (Signed)
Called pt state that she went to Urgent Care in [place]. Pt states that she was diagnosed with Bronchial cough / ear ache. Pt states that she tested Negative for COVID-19. States that she was prescribed prednisone and a cough syrup which is helping some, pt offered an appointment with Dr Sarajane Jews, state that she will try take the medications prescribed and if not getting better she will call to schedule appointment

## 2021-10-23 ENCOUNTER — Encounter: Payer: Self-pay | Admitting: Family Medicine

## 2021-10-23 ENCOUNTER — Ambulatory Visit (INDEPENDENT_AMBULATORY_CARE_PROVIDER_SITE_OTHER): Payer: BC Managed Care – PPO | Admitting: Family Medicine

## 2021-10-23 ENCOUNTER — Telehealth: Payer: Self-pay

## 2021-10-23 VITALS — BP 122/68 | HR 76 | Temp 97.1°F | Ht 63.5 in | Wt 140.4 lb

## 2021-10-23 DIAGNOSIS — J019 Acute sinusitis, unspecified: Secondary | ICD-10-CM

## 2021-10-23 DIAGNOSIS — R0981 Nasal congestion: Secondary | ICD-10-CM

## 2021-10-23 DIAGNOSIS — H66011 Acute suppurative otitis media with spontaneous rupture of ear drum, right ear: Secondary | ICD-10-CM

## 2021-10-23 LAB — POCT INFLUENZA A/B
Influenza A, POC: NEGATIVE
Influenza B, POC: NEGATIVE

## 2021-10-23 LAB — POC COVID19 BINAXNOW: SARS Coronavirus 2 Ag: NEGATIVE

## 2021-10-23 NOTE — Telephone Encounter (Signed)
She was seen this morning

## 2021-10-23 NOTE — Progress Notes (Signed)
° °  Subjective:    Patient ID: Susan Foster, female    DOB: 1971-09-14, 50 y.o.   MRN: [id number]  HPI Here to follow up on a visit to urgent care on 10-21-21. She presented that day with a 5 day hx of right ear pain and coughing. She had a right OM on exam and her lungs had wheezing. She was placed on Cefdinir and Prednisone 40 mg daily for 5 days. Since then the cough is a little better and the ear pain is better, but the sinus congestion is about the same. No body aches or fever or NVD. Rapid tests for flu and Covid-19 today are negative.    Review of Systems  Constitutional: Negative.   HENT:  Positive for congestion, ear pain, postnasal drip and sinus pressure. Negative for sore throat.   Eyes: Negative.   Respiratory:  Positive for cough and wheezing. Negative for shortness of breath.   Gastrointestinal: Negative.       Objective:   Physical Exam Constitutional:      Appearance: Normal appearance. She is not ill-appearing.  HENT:     Right Ear: Ear canal and external ear normal.     Left Ear: Tympanic membrane, ear canal and external ear normal.     Ears:     Comments: Right TM is flat but there is a small blood clot in the center of it, obviously it has perforated     Nose: Nose normal.     Mouth/Throat:     Pharynx: Oropharynx is clear.  Eyes:     Conjunctiva/sclera: Conjunctivae normal.  Pulmonary:     Effort: Pulmonary effort is normal.     Breath sounds: Normal breath sounds.  Lymphadenopathy:     Cervical: No cervical adenopathy.  Neurological:     Mental Status: She is alert.          Assessment & Plan:  Partially treated sinusitis and right OM. She will stay on the current regimen. Hopefully she will feel better in the next day or two. Recheck as needed. We spent a total of (30   ) minutes reviewing records and discussing these issues.  Alysia Penna, MD

## 2021-10-23 NOTE — Telephone Encounter (Addendum)
Patient has a 9:45 appointment.  Patient was seen in ED on 10/21/21

## 2021-10-23 NOTE — Telephone Encounter (Signed)
Patient calling in with respiratory symptoms: Shortness of breath, chest pain, palpitations or other red words send to Triage  Does the patient have a fever over 100, cough, congestion, sore throat, runny nose, lost of taste/smell (please list symptoms that patient has)?yes Cough  What date did symptoms start? 7 days (If over 5 days ago, pt may be scheduled for in person visit)  Have you tested for Covid in the last 5 days? No   If yes, was it positive []  OR negative [] ? If positive in the last 5 days, please schedule virtual visit now. If negative, schedule for an in person OV with the next available provider if PCP has no openings. Please also let patient know they will be tested again (follow the script below)  "you will have to arrive 26mins prior to your appt time to be Covid tested. Please park in back of office at the [facility] & call [phone] to let the staff know you have arrived. A staff member will meet you at your car to do a rapid covid test. Once the test has resulted you will be notified by phone of your results to determine if appt will remain an in person visit or be converted to a virtual/phone visit. If you arrive less than 40mins before your appt time, your visit will be automatically converted to virtual & any recommended testing will happen AFTER the visit."   [place]  If no availability for virtual visit in office,  please schedule another [place] office  If no availability at another [place] office, please instruct patient that they can schedule an evisit or virtual visit through their mychart account. Visits up to 8pm  patients can be seen in office 5 days after positive COVID test

## 2022-02-22 DIAGNOSIS — Z0000 Encounter for general adult medical examination without abnormal findings: Secondary | ICD-10-CM | POA: Diagnosis not present

## 2022-02-22 DIAGNOSIS — N939 Abnormal uterine and vaginal bleeding, unspecified: Secondary | ICD-10-CM | POA: Diagnosis not present

## 2022-04-05 DIAGNOSIS — N83209 Unspecified ovarian cyst, unspecified side: Secondary | ICD-10-CM | POA: Diagnosis not present

## 2022-08-06 DIAGNOSIS — Z6824 Body mass index (BMI) 24.0-24.9, adult: Secondary | ICD-10-CM | POA: Diagnosis not present

## 2022-08-06 DIAGNOSIS — N644 Mastodynia: Secondary | ICD-10-CM | POA: Diagnosis not present

## 2022-08-07 ENCOUNTER — Other Ambulatory Visit: Payer: Self-pay | Admitting: Obstetrics and Gynecology

## 2022-08-07 DIAGNOSIS — N644 Mastodynia: Secondary | ICD-10-CM

## 2022-08-19 ENCOUNTER — Ambulatory Visit
Admission: RE | Admit: 2022-08-19 | Discharge: 2022-08-19 | Disposition: A | Discharge status: Home / Self Care | Payer: BC Managed Care – PPO | Source: Ambulatory Visit | Attending: Obstetrics and Gynecology | Admitting: Obstetrics and Gynecology

## 2022-08-19 ENCOUNTER — Ambulatory Visit: Payer: BC Managed Care – PPO

## 2022-08-19 DIAGNOSIS — N644 Mastodynia: Secondary | ICD-10-CM

## 2022-09-12 DIAGNOSIS — Z01419 Encounter for gynecological examination (general) (routine) without abnormal findings: Secondary | ICD-10-CM | POA: Diagnosis not present

## 2022-12-19 DIAGNOSIS — H53143 Visual discomfort, bilateral: Secondary | ICD-10-CM | POA: Diagnosis not present

## 2023-01-15 ENCOUNTER — Encounter: Payer: Self-pay | Admitting: Family Medicine

## 2023-01-15 ENCOUNTER — Ambulatory Visit (INDEPENDENT_AMBULATORY_CARE_PROVIDER_SITE_OTHER): Payer: BC Managed Care – PPO | Admitting: Family Medicine

## 2023-01-15 VITALS — BP 98/64 | HR 65 | Temp 97.9°F | Wt 144.4 lb

## 2023-01-15 DIAGNOSIS — J3089 Other allergic rhinitis: Secondary | ICD-10-CM | POA: Diagnosis not present

## 2023-01-15 DIAGNOSIS — R059 Cough, unspecified: Secondary | ICD-10-CM | POA: Diagnosis not present

## 2023-01-15 LAB — POC COVID19 BINAXNOW: SARS Coronavirus 2 Ag: NEGATIVE

## 2023-01-15 MED ORDER — METHYLPREDNISOLONE ACETATE 80 MG/ML IJ SUSP
80.0000 mg | Freq: Once | INTRAMUSCULAR | Status: AC
  Administered 2023-01-15: 80 mg via INTRAMUSCULAR

## 2023-01-15 MED ORDER — METHYLPREDNISOLONE ACETATE 40 MG/ML IJ SUSP
40.0000 mg | Freq: Once | INTRAMUSCULAR | Status: AC
  Administered 2023-01-15: 40 mg via INTRAMUSCULAR

## 2023-01-15 MED ORDER — HYDROCODONE BIT-HOMATROP MBR 5-1.5 MG/5ML PO SOLN: 5.0000 mL | ORAL | 0 refills | Status: AC | PRN

## 2023-01-15 NOTE — Progress Notes (Signed)
   Subjective:    Patient ID: CHRYSTIN JEZIERSKI, female    DOB: [birth year], 52 y.o.   MRN: [id number]  HPI Here for 4 days of a hoarse voice and a dry cough. No fever or ST or body aches. She tested negative for the Covid virus at home. 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 She has learned that the office has mold in it. She is taking Zyrtec daily.    Review of Systems  Constitutional: Negative.   HENT:  Positive for voice change. Negative for congestion, ear pain, postnasal drip, sinus pressure and sore throat.   Eyes: Negative.   Respiratory:  Positive for cough. Negative for shortness of breath and wheezing.        Objective:   Physical Exam Constitutional:      Appearance: She is not ill-appearing.     Comments: Her voice is hoarse   HENT:     Right Ear: Tympanic membrane, ear canal and external ear normal.     Left Ear: Tympanic membrane, ear canal and external ear normal.     Nose: Nose normal.     Mouth/Throat:     Pharynx: Oropharynx is clear.  Eyes:     Conjunctiva/sclera: Conjunctivae normal.  Pulmonary:     Effort: Pulmonary effort is normal.     Breath sounds: Normal breath sounds.  Lymphadenopathy:     Cervical: No cervical adenopathy.  Neurological:     Mental Status: She is alert.           Assessment & Plan:  This is an allergy episode. We will give her a DepoMedrol shot. She is working remotely again, so this should resolve fairly quickly.  Alysia Penna, MD

## 2023-01-15 NOTE — Addendum Note (Signed)
Addended by: Wyvonne Lenz on: 01/15/2023 02:33 PM   Modules accepted: Orders

## 2023-01-15 NOTE — Addendum Note (Signed)
Addended by: Alysia Penna A on: 01/15/2023 02:03 PM   Modules accepted: Orders

## 2023-03-18 DIAGNOSIS — D2361 Other benign neoplasm of skin of right upper limb, including shoulder: Secondary | ICD-10-CM | POA: Diagnosis not present

## 2023-03-18 DIAGNOSIS — D2371 Other benign neoplasm of skin of right lower limb, including hip: Secondary | ICD-10-CM | POA: Diagnosis not present

## 2023-03-18 DIAGNOSIS — L821 Other seborrheic keratosis: Secondary | ICD-10-CM | POA: Diagnosis not present

## 2023-03-18 DIAGNOSIS — L814 Other melanin hyperpigmentation: Secondary | ICD-10-CM | POA: Diagnosis not present

## 2023-03-20 ENCOUNTER — Encounter: Payer: Self-pay | Admitting: Family Medicine

## 2023-03-20 ENCOUNTER — Ambulatory Visit (INDEPENDENT_AMBULATORY_CARE_PROVIDER_SITE_OTHER): Payer: BC Managed Care – PPO | Admitting: Family Medicine

## 2023-03-20 VITALS — BP 110/70 | HR 66 | Temp 98.1°F | Wt 141.0 lb

## 2023-03-20 DIAGNOSIS — R198 Other specified symptoms and signs involving the digestive system and abdomen: Secondary | ICD-10-CM | POA: Diagnosis not present

## 2023-03-20 NOTE — Progress Notes (Signed)
   Subjective:    Patient ID: Susan Foster, female    DOB: Apr 19, 1971, 52 y.o.   MRN: [id number]  HPI Here to check a "lump" in her upper abdomen that first appeared about 3 years ago. It has slower gotten larger. There is no pain or discomfort. Her BM's are regular. She notes that she has gained about 20 pounds over the past few years, and she does not exercise like she used to.    Review of Systems  Constitutional: Negative.   Respiratory: Negative.    Gastrointestinal: Negative.   Genitourinary: Negative.        Objective:   Physical Exam Constitutional:      Appearance: Normal appearance.  Cardiovascular:     Rate and Rhythm: Normal rate and regular rhythm.     Pulses: Normal pulses.     Heart sounds: Normal heart sounds.  Pulmonary:     Effort: Pulmonary effort is normal.     Breath sounds: Normal breath sounds.  Abdominal:     General: Bowel sounds are normal. There is no distension.     Palpations: Abdomen is soft.     Tenderness: There is no abdominal tenderness. There is no guarding or rebound.     Hernia: No hernia is present.     Comments: There is a slight prominence in the epigastric area compared to the rest of her abdomen. This is soft and ill-defined. No tenderness.   Neurological:     Mental Status: She is alert.           Assessment & Plan:  I reassured her that this is simply an area of fat accumulation under her skin which is more pronounced than other areas. She can make this go away by losing some weight. Recheck as needed.  Gershon Crane, MD

## 2023-07-02 DIAGNOSIS — R1031 Right lower quadrant pain: Secondary | ICD-10-CM | POA: Diagnosis not present

## 2023-07-02 DIAGNOSIS — Z6825 Body mass index (BMI) 25.0-25.9, adult: Secondary | ICD-10-CM | POA: Diagnosis not present

## 2023-07-02 DIAGNOSIS — R350 Frequency of micturition: Secondary | ICD-10-CM | POA: Diagnosis not present

## 2023-07-16 ENCOUNTER — Telehealth: Payer: Self-pay | Admitting: Family Medicine

## 2023-07-16 DIAGNOSIS — G8929 Other chronic pain: Secondary | ICD-10-CM

## 2023-07-16 NOTE — Telephone Encounter (Signed)
Pt called to ask if MD could please send a referral to the Neurologist she was sent to in December 2017?  Please advise.

## 2023-07-17 NOTE — Telephone Encounter (Signed)
Ok to scheduled pt an OV of place referral
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
## 2023-07-18 NOTE — Telephone Encounter (Signed)
She saw Dr. Shon Millet, but what is the reason for the referral?

## 2023-07-21 NOTE — Telephone Encounter (Signed)
I did the referral 

## 2023-07-25 NOTE — Telephone Encounter (Signed)
Pt notified via My Chart

## 2023-09-21 ENCOUNTER — Ambulatory Visit
Admission: RE | Admit: 2023-09-21 | Discharge: 2023-09-21 | Disposition: A | Discharge status: Home / Self Care | Payer: BC Managed Care – PPO | Source: Ambulatory Visit | Attending: Nurse Practitioner | Admitting: Nurse Practitioner

## 2023-09-21 VITALS — BP 121/68 | HR 73 | Temp 97.8°F | Resp 16

## 2023-09-21 DIAGNOSIS — R053 Chronic cough: Secondary | ICD-10-CM | POA: Diagnosis not present

## 2023-09-21 DIAGNOSIS — Z8709 Personal history of other diseases of the respiratory system: Secondary | ICD-10-CM

## 2023-09-21 DIAGNOSIS — R490 Dysphonia: Secondary | ICD-10-CM | POA: Diagnosis not present

## 2023-09-21 MED ORDER — PROMETHAZINE-DM 6.25-15 MG/5ML PO SYRP: 5.0000 mL | ORAL_SOLUTION | Freq: Four times a day (QID) | ORAL | 0 refills | Status: DC | PRN

## 2023-09-21 MED ORDER — METHYLPREDNISOLONE SODIUM SUCC 125 MG IJ SOLR
80.0000 mg | Freq: Once | INTRAMUSCULAR | Status: AC
  Administered 2023-09-21: 80 mg via INTRAMUSCULAR

## 2023-09-21 NOTE — ED Triage Notes (Signed)
Pt reports she has a swollen lymph node on the left side of her neck, cough, and has lost her voice x 6 days

## 2023-09-21 NOTE — Discharge Instructions (Addendum)
You were given an injection of Solu-Medrol 80 mg today. Take medications as prescribed. Increase fluids and allow for plenty of rest. May take over-the-counter Tylenol or ibuprofen as needed for pain, fever, general discomfort. Warm salt water gargles as needed for throat pain, discomfort, or hoarseness. Voice rest to help with current symptoms. May use over-the-counter cough drops to help with cough as needed. Recommend using a humidifier in your bedroom at nighttime during sleep. As discussed, if symptoms do not improve with this treatment, or if they appear to be worsening, you may follow-up in this clinic or with your primary care physician for further evaluation. Follow-up as needed.
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
## 2023-09-24 ENCOUNTER — Ambulatory Visit (INDEPENDENT_AMBULATORY_CARE_PROVIDER_SITE_OTHER): Payer: BC Managed Care – PPO | Admitting: Family Medicine

## 2023-09-24 ENCOUNTER — Encounter: Payer: Self-pay | Admitting: Family Medicine

## 2023-09-24 VITALS — BP 102/64 | HR 82 | Temp 98.4°F | Wt 149.2 lb

## 2023-09-24 DIAGNOSIS — J029 Acute pharyngitis, unspecified: Secondary | ICD-10-CM

## 2023-09-24 MED ORDER — HYDROCODONE BIT-HOMATROP MBR 5-1.5 MG/5ML PO SOLN: 5.0000 mL | ORAL | 0 refills | Status: DC | PRN

## 2023-09-24 MED ORDER — CEFUROXIME AXETIL 500 MG PO TABS: 500.0000 mg | ORAL_TABLET | Freq: Two times a day (BID) | ORAL | 0 refills | Status: AC

## 2023-09-24 NOTE — Progress Notes (Signed)
   Subjective:    Patient ID: Susan Foster, female    DOB: 10/01/1971, 52 y.o.   MRN: [id number]  HPI Here for one week of throat congestion and swelling with a bad ST. She also has a dry cough. No fever or sinus pressure. She was seen at urgent care on 09-21-23, and she was given a shot of SoluMedrol. This did not help, and she still has these same symptoms.    Review of Systems  Constitutional: Negative.  Negative for activity change, appetite change, diaphoresis, fatigue, fever and unexpected weight change.  HENT:  Positive for congestion, postnasal drip, sore throat and voice change. Negative for ear pain, hearing loss, nosebleeds, sinus pressure, sinus pain, tinnitus and trouble swallowing.   Eyes: Negative.  Negative for photophobia, pain, discharge, redness and visual disturbance.  Respiratory:  Positive for cough. Negative for apnea, choking, chest tightness, shortness of breath, wheezing and stridor.   Cardiovascular: Negative.  Negative for chest pain, palpitations and leg swelling.  Gastrointestinal: Negative.  Negative for abdominal distention, abdominal pain, blood in stool, constipation, diarrhea, nausea, rectal pain and vomiting.  Genitourinary: Negative.  Negative for difficulty urinating, dysuria, enuresis, flank pain, frequency, hematuria, menstrual problem, urgency, vaginal bleeding, vaginal discharge and vaginal pain.  Musculoskeletal: Negative.  Negative for arthralgias, back pain, gait problem, joint swelling, myalgias, neck pain and neck stiffness.  Skin: Negative.  Negative for color change, pallor, rash and wound.  Neurological: Negative.  Negative for dizziness, tremors, seizures, syncope, speech difficulty, weakness, light-headedness, numbness and headaches.  Hematological:  Negative for adenopathy. Does not bruise/bleed easily.  Psychiatric/Behavioral: Negative.  Negative for agitation, behavioral problems, confusion, dysphoric mood, hallucinations and sleep  disturbance. The patient is not nervous/anxious.        Objective:   Physical Exam Constitutional:      Appearance: Normal appearance. She is not ill-appearing.  HENT:     Right Ear: Tympanic membrane, ear canal and external ear normal.     Left Ear: Tympanic membrane, ear canal and external ear normal.     Nose: Nose normal.     Mouth/Throat:     Pharynx: Posterior oropharyngeal erythema present. No oropharyngeal exudate.     Comments: Her voice is hoarse  Eyes:     Conjunctiva/sclera: Conjunctivae normal.  Pulmonary:     Effort: Pulmonary effort is normal.     Breath sounds: Normal breath sounds.  Lymphadenopathy:     Cervical: No cervical adenopathy.  Neurological:     Mental Status: She is alert.           Assessment & Plan:  Pharyngitis, likely Streptococcal. Treat with 10 days of Cefuroxime.  Gershon Crane, MD

## 2024-01-01 ENCOUNTER — Ambulatory Visit: Payer: Self-pay | Admitting: Family Medicine

## 2024-01-01 NOTE — Telephone Encounter (Signed)
  Chief Complaint: lymph node swelling, reoccurring nosebleeds Symptoms: hoarse voice, nosebleeds, cough Frequency: nosebleeds intermittent x 1-2 months, hoarse voice x 1 week and lymph node swelling x today Pertinent Negatives: Patient denies chest congestion, sore throat Disposition: [] ED /[] Urgent Care (no appt availability in office) / [x] Appointment(In office/virtual)/ []  [facility]/ [] Home Care/ [] Refused Recommended Disposition /[] [facility]/ []  Follow-up with PCP Additional Notes: Patient states for the past year she has a reoccurring symptom: hoarse voice and swelling in her lymph nodes. She reports she woke up this morning with left lymph node swelling between collarbone and neck, burning and feels warm. Over the past month or 2 heavy nosebleeds, last one was about a week ago. Patient states she saw an ENT specialist in the past and was scoped. Patient adamantly denies any cold symptoms, however, at the end of triage noted that patient is coughing on the phone. Advised patient to call back for any new or worsening symptoms.  Copied from CRM [id number]. Topic: Clinical - Red Word Triage >> Jan 01, 2024  8:57 AM Kathryne Eriksson wrote: Red Word that prompted transfer to Nurse Triage: Swollen / Burning Lymph Nodes Reason for Disposition  Hard-to-stop nosebleeds are a chronic symptom (recurrent or ongoing AND present > 4 weeks)  Answer Assessment - Initial Assessment Questions 1. AMOUNT OF BLEEDING: "How bad is the bleeding?" "How much blood was lost?" "Has the bleeding stopped?"   - MILD: needed a couple tissues   - MODERATE: needed many tissues   - SEVERE: large blood clots, soaked many tissues, lasted more than 30 minutes      Some clots, sometimes its just blood. She states she doesn't think they last 30 minutes.   2. ONSET: "When did the nosebleed start?"      X 1-2 months, last nosebleed was about a week ago.  3. FREQUENCY: "How many nosebleeds have you had in  the last 24 hours?"      None.  4. RECURRENT SYMPTOMS: "Have there been other recent nosebleeds?" If Yes, ask: "How long did it take you to stop the bleeding?" "What worked best?"      Denies.  5. CAUSE: "What do you think caused this nosebleed?"     Patient states the first thing she thought of was the dry weather.  6. LOCAL FACTORS: "Do you have any cold symptoms?", "Have you been rubbing or picking at your nose?"     She states she started with a runny nose on the right side this morning but not previously.  7. SYSTEMIC FACTORS: "Do you have high blood pressure or any bleeding problems?"     Denies.  8. BLOOD THINNERS: "Do you take any blood thinners?" (e.g., aspirin, clopidogrel / Plavix, coumadin, heparin). Notes: Other strong blood thinners include: Arixtra (fondaparinux), Eliquis (apixaban), Pradaxa (dabigatran), and Xarelto (rivaroxaban).     Denies.  9. OTHER SYMPTOMS: "Do you have any other symptoms?" (e.g., lightheadedness)     Hoarse voice, left lymph nodes swollen.  Protocols used: Nosebleed-A-AH

## 2024-01-06 ENCOUNTER — Encounter: Payer: Self-pay | Admitting: Family Medicine

## 2024-01-06 ENCOUNTER — Ambulatory Visit (INDEPENDENT_AMBULATORY_CARE_PROVIDER_SITE_OTHER): Payer: BC Managed Care – PPO | Admitting: Family Medicine

## 2024-01-06 VITALS — BP 110/70 | HR 72 | Temp 98.1°F | Wt 147.0 lb

## 2024-01-06 DIAGNOSIS — R040 Epistaxis: Secondary | ICD-10-CM | POA: Diagnosis not present

## 2024-01-06 MED ORDER — MUPIROCIN 2 % EX OINT: 1.0000 | TOPICAL_OINTMENT | Freq: Three times a day (TID) | CUTANEOUS | 0 refills | Status: AC

## 2024-01-06 NOTE — Progress Notes (Signed)
   Subjective:    Patient ID: Susan Foster, female    DOB: November 06, 1971, 53 y.o.   MRN: [id number]  HPI Here for several weeks of intermittent soreness and bleeding from the left nostril. No sinus issues or ST or cough.    Review of Systems  Constitutional: Negative.   HENT:  Positive for nosebleeds. Negative for congestion, ear pain, sinus pain and sore throat.   Eyes: Negative.   Respiratory: Negative.         Objective:   Physical Exam Constitutional:      Appearance: Normal appearance.  HENT:     Right Ear: Tympanic membrane, ear canal and external ear normal.     Left Ear: Tympanic membrane, ear canal and external ear normal.     Nose:     Comments: Left nostril is tender and inflamed. No active bleeding    Mouth/Throat:     Pharynx: Oropharynx is clear.  Eyes:     Conjunctiva/sclera: Conjunctivae normal.  Pulmonary:     Effort: Pulmonary effort is normal.     Breath sounds: Normal breath sounds.  Lymphadenopathy:     Cervical: No cervical adenopathy.  Neurological:     Mental Status: She is alert.           Assessment & Plan:  Nosebleeds, likely due to a Staph infection. Treat with Mupiricin ointment TID for 2 weeks. Recheck as needed.  Gershon Crane, MD

## 2024-02-25 DIAGNOSIS — H40033 Anatomical narrow angle, bilateral: Secondary | ICD-10-CM | POA: Diagnosis not present

## 2024-02-25 DIAGNOSIS — H2513 Age-related nuclear cataract, bilateral: Secondary | ICD-10-CM | POA: Diagnosis not present

## 2024-02-25 DIAGNOSIS — D3131 Benign neoplasm of right choroid: Secondary | ICD-10-CM | POA: Diagnosis not present

## 2024-02-25 DIAGNOSIS — H53143 Visual discomfort, bilateral: Secondary | ICD-10-CM | POA: Diagnosis not present

## 2024-03-02 ENCOUNTER — Ambulatory Visit (INDEPENDENT_AMBULATORY_CARE_PROVIDER_SITE_OTHER): Admitting: Family Medicine

## 2024-03-02 VITALS — BP 110/70 | HR 88 | Temp 98.1°F | Wt 147.0 lb

## 2024-03-02 DIAGNOSIS — R739 Hyperglycemia, unspecified: Secondary | ICD-10-CM | POA: Diagnosis not present

## 2024-03-02 DIAGNOSIS — H359 Unspecified retinal disorder: Secondary | ICD-10-CM

## 2024-03-02 LAB — CBC WITH DIFFERENTIAL/PLATELET
Basophils Absolute: 0.1 10*3/uL (ref 0.0–0.1)
Basophils Relative: 1.1 % (ref 0.0–3.0)
Eosinophils Absolute: 0.1 10*3/uL (ref 0.0–0.7)
Eosinophils Relative: 1.4 % (ref 0.0–5.0)
HCT: 42.8 % (ref 36.0–46.0)
Hemoglobin: 14.1 g/dL (ref 12.0–15.0)
Lymphocytes Relative: 33.9 % (ref 12.0–46.0)
Lymphs Abs: 2.1 10*3/uL (ref 0.7–4.0)
MCHC: 33 g/dL (ref 30.0–36.0)
MCV: 90.2 fl (ref 78.0–100.0)
Monocytes Absolute: 0.4 10*3/uL (ref 0.1–1.0)
Monocytes Relative: 6.9 % (ref 3.0–12.0)
Neutro Abs: 3.5 10*3/uL (ref 1.4–7.7)
Neutrophils Relative %: 56.7 % (ref 43.0–77.0)
Platelets: 234 10*3/uL (ref 150.0–400.0)
RBC: 4.75 Mil/uL (ref 3.87–5.11)
RDW: 13.3 % (ref 11.5–15.5)
WBC: 6.2 10*3/uL (ref 4.0–10.5)

## 2024-03-02 LAB — LIPID PANEL
Cholesterol: 195 mg/dL (ref 0–200)
HDL: 76.8 mg/dL (ref >39.00)
LDL Cholesterol: 100 mg/dL — ABNORMAL HIGH (ref 0–99)
NonHDL: 118.45
Total CHOL/HDL Ratio: 3
Triglycerides: 93 mg/dL (ref 0.0–149.0)
VLDL: 18.6 mg/dL (ref 0.0–40.0)

## 2024-03-02 LAB — BASIC METABOLIC PANEL WITH GFR
BUN: 14 mg/dL (ref 6–23)
CO2: 29 meq/L (ref 19–32)
Calcium: 9.7 mg/dL (ref 8.4–10.5)
Chloride: 104 meq/L (ref 96–112)
Creatinine, Ser: 0.7 mg/dL (ref 0.40–1.20)
GFR: 98.42 mL/min (ref >60.00)
Glucose, Bld: 91 mg/dL (ref 70–99)
Potassium: 3.9 meq/L (ref 3.5–5.1)
Sodium: 141 meq/L (ref 135–145)

## 2024-03-02 LAB — HEPATIC FUNCTION PANEL
ALT: 11 U/L (ref 0–35)
AST: 16 U/L (ref 0–37)
Albumin: 4.6 g/dL (ref 3.5–5.2)
Alkaline Phosphatase: 65 U/L (ref 39–117)
Bilirubin, Direct: 0 mg/dL (ref 0.0–0.3)
Total Bilirubin: 0.4 mg/dL (ref 0.2–1.2)
Total Protein: 7.5 g/dL (ref 6.0–8.3)

## 2024-03-02 LAB — HEMOGLOBIN A1C: Hgb A1c MFr Bld: 5.3 % (ref 4.6–6.5)

## 2024-03-02 LAB — TSH: TSH: 1.54 u[IU]/mL (ref 0.35–5.50)

## 2024-03-02 NOTE — Progress Notes (Signed)
   Subjective:    Patient ID: Susan Foster, female    DOB: 07/21/1971, 53 y.o.   MRN: [id number]  HPI Here to discuss an abnormal eye exam. She saw her optometrist recently, and they said they saw a "black spot" in her right retina. They suggested she see us  and get her lipids checked. She has never had HTN. No hx of eye trauma. Her vision is normal. 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    Review of Systems  Constitutional: Negative.   Eyes: Negative.   Respiratory: Negative.    Cardiovascular: Negative.   Neurological: Negative.        Objective:   Physical Exam Constitutional:      Appearance: Normal appearance.  Eyes:     Conjunctiva/sclera: Conjunctivae normal.     Pupils: Pupils are equal, round, and reactive to light.  Cardiovascular:     Rate and Rhythm: Normal rate and regular rhythm.     Pulses: Normal pulses.     Heart sounds: Normal heart sounds.  Pulmonary:     Effort: Pulmonary effort is normal.     Breath sounds: Normal breath sounds.  Neurological:     Mental Status: She is alert.           Assessment & Plan:  Abnormal eye exam. We will get labs including lipids and an A1c today. Refer to ophthalmology.  Corita Diego, MD

## 2024-03-09 ENCOUNTER — Telehealth: Payer: Self-pay

## 2024-03-09 NOTE — Telephone Encounter (Signed)
 Spoke with pt regarding her Ophthalmology referral placed by Dr Alyne Babinski, provided pt with the office phone number to [facility], pt will call the office for scheduling

## 2024-05-26 ENCOUNTER — Telehealth: Payer: Self-pay | Admitting: Family Medicine

## 2024-05-26 DIAGNOSIS — H359 Unspecified retinal disorder: Secondary | ICD-10-CM

## 2024-05-26 DIAGNOSIS — G43909 Migraine, unspecified, not intractable, without status migrainosus: Secondary | ICD-10-CM

## 2024-05-26 NOTE — Telephone Encounter (Signed)
 The last two referrals I see is 03/03/24 and 07/25/23 so I am not sure if those are the updated referrals.   Please advise.  Copied from CRM [id number]. Topic: Referral - Status >> May 26, 2024  8:11 AM Larissa RAMAN wrote: Reason for CRM: Patient calling to check the status of neurology and optometry referrals  Callback # [phone]  The last two referrals I see is 03/03/24 and 07/25/23 so I am not sure if those are the updated referrals.

## 2024-05-31 NOTE — Telephone Encounter (Signed)
 I did a referral to Dr. Arley Ruder in [place]

## 2024-06-24 NOTE — Telephone Encounter (Signed)
 Spoke with Dr Loretha office regarding pt referral, agent stated that they did not receive referral due to the office nolonger using Epic, refax the referral, pt last office notes and pt demographics and confirmed with the office that they received the fax. Pt notified that se will get a call for scheduling

## 2024-06-30 ENCOUNTER — Telehealth: Payer: Self-pay

## 2024-06-30 NOTE — Telephone Encounter (Signed)
 This encounter was resolved this morning with Dr Elner office pt will receive a call to schedule Copied from CRM [id number]. Topic: Referral - Status >> Jun 30, 2024 11:22 AM Susan Foster wrote: Reason for CRM: Patient called in stating that Dr. Elner office has still not receive the referral from Dr. Johnny. They are waiting for the referral to be faxed. they are not apart of [facility] so that the referral needs to be faxed over. Patient did not provide the fax number. Patient can be reached at [phone] to update her once the referral has been faxed. Patient is very upset, said that she has been calling and trying to get the referral faxed over since May 2025.

## 2024-06-30 NOTE — Telephone Encounter (Signed)
 Refax pt referral again this morning confirmed with Dr Georgette office that I had the correct fax number, received a successful confirmation

## 2024-07-05 DIAGNOSIS — H2513 Age-related nuclear cataract, bilateral: Secondary | ICD-10-CM | POA: Diagnosis not present

## 2024-07-05 DIAGNOSIS — D3131 Benign neoplasm of right choroid: Secondary | ICD-10-CM | POA: Diagnosis not present

## 2024-07-05 DIAGNOSIS — H43823 Vitreomacular adhesion, bilateral: Secondary | ICD-10-CM | POA: Diagnosis not present

## 2024-07-05 DIAGNOSIS — H53143 Visual discomfort, bilateral: Secondary | ICD-10-CM | POA: Diagnosis not present

## 2024-07-05 DIAGNOSIS — G43B0 Ophthalmoplegic migraine, not intractable: Secondary | ICD-10-CM | POA: Diagnosis not present

## 2024-07-06 NOTE — Addendum Note (Signed)
 Addended by: JOHNNY SENIOR A on: 07/06/2024 09:01 AM   Modules accepted: Orders

## 2024-07-06 NOTE — Telephone Encounter (Signed)
 She saw Dr. Juliene Dunnings then, so I did a referral to see him again

## 2024-07-21 ENCOUNTER — Telehealth: Payer: Self-pay | Admitting: Family Medicine

## 2024-07-21 ENCOUNTER — Encounter (HOSPITAL_COMMUNITY): Payer: Self-pay

## 2024-07-21 ENCOUNTER — Other Ambulatory Visit: Payer: Self-pay

## 2024-07-21 ENCOUNTER — Emergency Department (HOSPITAL_COMMUNITY)
Admission: EM | Admit: 2024-07-21 | Discharge: 2024-07-21 | Disposition: A | Discharge status: Home / Self Care | Attending: Emergency Medicine | Admitting: Emergency Medicine

## 2024-07-21 DIAGNOSIS — Z91040 Latex allergy status: Secondary | ICD-10-CM | POA: Insufficient documentation

## 2024-07-21 DIAGNOSIS — R42 Dizziness and giddiness: Secondary | ICD-10-CM | POA: Diagnosis not present

## 2024-07-21 DIAGNOSIS — G43109 Migraine with aura, not intractable, without status migrainosus: Secondary | ICD-10-CM | POA: Diagnosis not present

## 2024-07-21 DIAGNOSIS — R519 Headache, unspecified: Secondary | ICD-10-CM | POA: Diagnosis not present

## 2024-07-21 DIAGNOSIS — R9431 Abnormal electrocardiogram [ECG] [EKG]: Secondary | ICD-10-CM | POA: Diagnosis not present

## 2024-07-21 DIAGNOSIS — R531 Weakness: Secondary | ICD-10-CM | POA: Insufficient documentation

## 2024-07-21 LAB — CBC WITH DIFFERENTIAL/PLATELET
Abs Immature Granulocytes: 0.01 K/uL (ref 0.00–0.07)
Basophils Absolute: 0.1 K/uL (ref 0.0–0.1)
Basophils Relative: 2 %
Eosinophils Absolute: 0.6 K/uL — ABNORMAL HIGH (ref 0.0–0.5)
Eosinophils Relative: 9 %
HCT: 44.2 % (ref 36.0–46.0)
Hemoglobin: 14.6 g/dL (ref 12.0–15.0)
Immature Granulocytes: 0 %
Lymphocytes Relative: 33 %
Lymphs Abs: 2.2 K/uL (ref 0.7–4.0)
MCH: 29.9 pg (ref 26.0–34.0)
MCHC: 33 g/dL (ref 30.0–36.0)
MCV: 90.6 fL (ref 80.0–100.0)
Monocytes Absolute: 0.5 K/uL (ref 0.1–1.0)
Monocytes Relative: 8 %
Neutro Abs: 3.3 K/uL (ref 1.7–7.7)
Neutrophils Relative %: 48 %
Platelets: 232 K/uL (ref 150–400)
RBC: 4.88 MIL/uL (ref 3.87–5.11)
RDW: 12.5 % (ref 11.5–15.5)
WBC: 6.7 K/uL (ref 4.0–10.5)
nRBC: 0 % (ref 0.0–0.2)

## 2024-07-21 LAB — COMPREHENSIVE METABOLIC PANEL WITH GFR
ALT: 14 U/L (ref 0–44)
AST: 16 U/L (ref 15–41)
Albumin: 4.1 g/dL (ref 3.5–5.0)
Alkaline Phosphatase: 64 U/L (ref 38–126)
Anion gap: 12 (ref 5–15)
BUN: 22 mg/dL — ABNORMAL HIGH (ref 6–20)
CO2: 25 mmol/L (ref 22–32)
Calcium: 9.3 mg/dL (ref 8.9–10.3)
Chloride: 103 mmol/L (ref 98–111)
Creatinine, Ser: 0.78 mg/dL (ref 0.44–1.00)
GFR, Estimated: >60 mL/min (ref >60)
Glucose, Bld: 94 mg/dL (ref 70–99)
Potassium: 3.8 mmol/L (ref 3.5–5.1)
Sodium: 140 mmol/L (ref 135–145)
Total Bilirubin: 0.5 mg/dL (ref 0.0–1.2)
Total Protein: 7.4 g/dL (ref 6.5–8.1)

## 2024-07-21 LAB — MAGNESIUM: Magnesium: 2.2 mg/dL (ref 1.7–2.4)

## 2024-07-21 MED ORDER — DIPHENHYDRAMINE HCL 50 MG/ML IJ SOLN
25.0000 mg | Freq: Once | INTRAMUSCULAR | Status: AC
  Administered 2024-07-21: 25 mg via INTRAVENOUS
  Filled 2024-07-21 (×2): qty 1

## 2024-07-21 MED ORDER — METOCLOPRAMIDE HCL 5 MG/ML IJ SOLN
10.0000 mg | Freq: Once | INTRAMUSCULAR | Status: AC
  Administered 2024-07-21: 10 mg via INTRAVENOUS
  Filled 2024-07-21 (×2): qty 2

## 2024-07-21 MED ORDER — DEXAMETHASONE SODIUM PHOSPHATE 10 MG/ML IJ SOLN
10.0000 mg | Freq: Once | INTRAMUSCULAR | Status: AC
  Administered 2024-07-21: 10 mg via INTRAVENOUS
  Filled 2024-07-21 (×2): qty 1

## 2024-07-21 MED ORDER — METOCLOPRAMIDE HCL 10 MG PO TABS: 10.0000 mg | ORAL_TABLET | Freq: Four times a day (QID) | ORAL | 0 refills | Status: DC

## 2024-07-21 MED ORDER — LACTATED RINGERS IV BOLUS
1000.0000 mL | Freq: Once | INTRAVENOUS | Status: AC
  Administered 2024-07-21: 1000 mL via INTRAVENOUS

## 2024-07-21 NOTE — Telephone Encounter (Unsigned)
 Copied from CRM #[id number]. Topic: Referral - Question >> Jul 21, 2024 10:49 AM Viola F wrote: Reason for CRM: Patient called to follow up on the referral to Dr. Skeet in Neurology - they are waiting for more information about the condition in order to get her scheduled. Patient asked that office please submit information as soon as possible and to give her a call/mychart message with an update.

## 2024-07-21 NOTE — ED Provider Notes (Signed)
  EMERGENCY DEPARTMENT AT [facility] Provider Note   CSN: [id number] Arrival date & time: 07/21/24  1200     Patient presents with: Weakness   Susan Foster is a 53 y.o. female.  {Add pertinent medical, surgical, social history, OB history to HPI:2425} 53 year old female with a history of ocular migraines and complicated migraines who presents to the emergency department with headache and numbness and tingling.  Patient reports that an hour prior to arrival she started having a headache on the right side of her face.  Also is having some numbness and tingling on the side of her face as well.  She states that she also felt lightheaded like her knees were going to give out.  Has had something similar happen in the past and is being referred to neurology for this.  Per chart review it appears that she has had left-sided facial pain and loss of facial expression with prior complicated migraines       Prior to Admission medications   Medication Sig Start Date End Date Taking? Authorizing Provider  Ascorbic Acid (VITAMIN C) 1000 MG tablet Take 1,000 mg by mouth daily.    [provider]  BIOTIN PO Take 1 tablet by mouth daily.    [provider]  cetirizine (ZYRTEC) 10 MG tablet Take 10 mg by mouth daily as needed.     [provider]  HYDROcodone  bit-homatropine (HYCODAN) 5-1.5 MG/5ML syrup Take 5 mLs by mouth every 4 (four) hours as needed for cough. 01/15/23   Johnny Garnette LABOR, MD  Multiple Vitamins-Minerals (MULTIVITAMIN WITH MINERALS) tablet Take 1 tablet by mouth daily.    [provider]  mupirocin  ointment (BACTROBAN ) 2 % Apply 1 Application topically 3 (three) times daily. 01/06/24   Johnny Garnette LABOR, MD  Omega-3 Fatty Acids (FISH OIL PO) Take by mouth daily. 1/4 cup    [provider]  valACYclovir  (VALTREX ) 500 MG tablet Take 500 mg by mouth daily as needed.    [provider]  Zinc 50 MG CAPS Take 1 capsule  by mouth daily.    [provider]    Allergies: Augmentin [amoxicillin-pot clavulanate], Latex, Metronidazole, and Amoxicillin    Review of Systems  Updated Vital Signs BP 134/76 (BP Location: Right Arm)   Pulse 87   Temp 97.6 F (36.4 C)   Resp 16   Ht 5' 3.5 (1.613 m)   Wt 66.7 kg   SpO2 100%   BMI 25.64 kg/m   Physical Exam Constitutional:      Appearance: Normal appearance.     Comments: Tearful and anxious  HENT:     Head: Normocephalic and atraumatic.  Neurological:     Mental Status: She is alert.     Comments: NIHSS Exam  Level of Consciousness: Alert  LOC Questions: Answers Month and Age Correctly  LOC Commands: Opens and Closes Eyes and Hands on command  Best Gaze: Horizontal ocular movements intact  Visual Fields: No visual field loss  Facial Palsy: None  L Upper Extremity Motor: No drift after 10 seconds  R Upper Extremity Motor: No drift after 10 seconds  L Lower extremity Motor: No drift after 5 seconds  R Lower extremity Motor: No drift after 5 seconds  Ataxia: Absent  Sensory: Intact sensation to light touch on face, arms, trunk, and legs bilaterally  Best Language: No aphasia  Dysarthria: No dysarthria  Neglect: No visual or sensory neglect        (  all labs ordered are listed, but only abnormal results are displayed) Labs Reviewed - No data to display  EKG: None  Radiology: No results found.  {Document cardiac monitor, telemetry assessment procedure when appropriate:32947} Procedures   Medications Ordered in the ED - No data to display    {Click here for ABCD2, HEART and other calculators REFRESH Note before signing:1}                              Medical Decision Making Amount and/or Complexity of Data Reviewed Labs: ordered.  Risk Prescription drug management.   ***  {Document critical care time when appropriate  Document review of labs and clinical decision tools ie CHADS2VASC2, etc  Document your independent  review of radiology images and any outside records  Document your discussion with family members, caretakers and with consultants  Document social determinants of health affecting pt's care  Document your decision making why or why not admission, treatments were needed:32947:::1}   Final diagnoses:  None    ED Discharge Orders     None

## 2024-07-21 NOTE — ED Notes (Signed)
 Pt refuses all prescribed medicine and fluids; stated she's going to tough it out. Pt educated on importance of said meds and fluids.

## 2024-07-21 NOTE — ED Notes (Signed)
 Per EDP, NIH score 0 in Triage, not warranting activation of Code Stroke.

## 2024-07-21 NOTE — Discharge Instructions (Signed)
 You were seen for your headache and lightheadedness in the emergency department.   At home, please stay well-hydrated.  Take the Reglan  with Benadryl  if you have a severe headache that does not respond to Tylenol  and ibuprofen.  Please note this can make you drowsy.    Check your MyChart online for the results of any tests that had not resulted by the time you left the emergency department.   Follow-up with your primary doctor in 2-3 days regarding your visit.  Follow-up with neurology.  Return immediately to the emergency department if you experience any of the following: Worsening pain, passing out, new weakness or numbness, or any other concerning symptoms.    Thank you for visiting our Emergency Department. It was a pleasure taking care of you today.

## 2024-07-21 NOTE — ED Triage Notes (Signed)
 Pt arrived via POV from home c/o new onset weakness, right facial numbness, headache and reports he voice sounds fucked up. EDP Notified in Triage. Pt reports symptoms began while standing in her kitchen apprx at 1100 today.

## 2024-07-22 ENCOUNTER — Encounter: Payer: Self-pay | Admitting: Neurology

## 2024-07-22 NOTE — Telephone Encounter (Signed)
 Spoke with [facility] Dr Stefano office regarding pt appointment scheduling, states that they explained to pt that they needed the notes from pt Optometrist so Dr Skeet can review them before scheduling appointment. I faxed over the last office notes from Dr Johnny and spoke with pt with the update, pt stated that she was going to call her optometrist and request for them  to fax the records to Dr Stefano office

## 2024-07-22 NOTE — Telephone Encounter (Signed)
 I spoke to both Dr Stefano office, pt is aware of what Dr Stefano office needs from her Optometry

## 2024-08-09 ENCOUNTER — Encounter: Payer: Self-pay | Admitting: Neurology

## 2024-08-09 ENCOUNTER — Ambulatory Visit: Admitting: Neurology

## 2024-08-09 VITALS — BP 100/65 | HR 65 | Ht 63.0 in | Wt 138.0 lb

## 2024-08-09 DIAGNOSIS — G43101 Migraine with aura, not intractable, with status migrainosus: Secondary | ICD-10-CM

## 2024-08-09 DIAGNOSIS — M542 Cervicalgia: Secondary | ICD-10-CM
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
## 2024-08-09 NOTE — Patient Instructions (Addendum)
  MRI of brain and cervical spine without contrast. We have sent a referral to [facility] for your MRI and they will call you directly to schedule your appointment. They are located at [address] [facility]. If you need to contact them directly please call [phone].  Limit use of pain relievers to no more than 9 days out of the month.  These medications include acetaminophen , NSAIDs (ibuprofen/Advil/Motrin, naproxen/Aleve, triptans (Imitrex/sumatriptan), Excedrin, and narcotics.  This will help reduce risk of rebound headaches. Be aware of common food triggers:  - Caffeine:  coffee, black tea, cola, Mt. Dew  - Chocolate  - Dairy:  aged cheeses (brie, blue, cheddar, gouda, Parmasan, provolone, romano, Swiss, etc), chocolate milk, buttermilk, sour cream, limit eggs and yogurt  - Nuts, peanut butter  - Alcohol  - Cereals/grains:  FRESH breads (fresh bagels, sourdough, doughnuts), yeast productions  - Processed/canned/aged/cured meats (pre-packaged deli meats, hotdogs)  - MSG/glutamate:  soy sauce, flavor enhancer, pickled/preserved/marinated foods  - Sweeteners:  aspartame (Equal, Nutrasweet).  Sugar and Splenda are okay  - Vegetables:  legumes (lima beans, lentils, snow peas, fava beans, pinto peans, peas, garbanzo beans), sauerkraut, onions, olives, pickles  - Fruit:  avocados, bananas, citrus fruit (orange, lemon, grapefruit), mango  - Other:  Frozen meals, macaroni and cheese Routine exercise Stay adequately hydrated (aim for 64 oz water daily) Keep headache diary Maintain proper stress management Maintain proper sleep hygiene Do not skip meals Consider supplements:  magnesium citrate 400mg  daily, riboflavin 400mg  daily, coenzyme Q10 100mg  three times daily.

## 2024-08-11 DIAGNOSIS — D3131 Benign neoplasm of right choroid: Secondary | ICD-10-CM | POA: Diagnosis not present

## 2024-08-11 DIAGNOSIS — H53143 Visual discomfort, bilateral: Secondary | ICD-10-CM | POA: Diagnosis not present

## 2024-08-13 ENCOUNTER — Encounter: Payer: Self-pay | Admitting: Neurology

## 2024-08-21 ENCOUNTER — Ambulatory Visit
Admission: RE | Admit: 2024-08-21 | Discharge: 2024-08-21 | Disposition: A | Source: Ambulatory Visit | Attending: Neurology

## 2024-08-21 DIAGNOSIS — R519 Headache, unspecified: Secondary | ICD-10-CM | POA: Diagnosis not present

## 2024-08-21 DIAGNOSIS — G43101 Migraine with aura, not intractable, with status migrainosus: Secondary | ICD-10-CM

## 2024-08-21 DIAGNOSIS — M542 Cervicalgia: Secondary | ICD-10-CM

## 2024-08-21 DIAGNOSIS — M4802 Spinal stenosis, cervical region: Secondary | ICD-10-CM | POA: Diagnosis not present

## 2024-08-21 DIAGNOSIS — M47812 Spondylosis without myelopathy or radiculopathy, cervical region: Secondary | ICD-10-CM | POA: Diagnosis not present

## 2024-08-24 ENCOUNTER — Ambulatory Visit: Payer: Self-pay | Admitting: Neurology

## 2024-08-24 NOTE — Progress Notes (Signed)
 Patient advised of her results.

## 2024-08-25 ENCOUNTER — Telehealth: Payer: Self-pay

## 2024-08-25 NOTE — Telephone Encounter (Signed)
 Called patient with MRI results and she was asking me if Dr. Ginnie Pinal had sent over the results from her last eye exam were she noticed that the freckle in her eye has grown and she does not know if that is what is causing some of her symptoms. I did not see the notes from this appointment so patient is calling and getting notes sent over

## 2024-09-16 DIAGNOSIS — Z01419 Encounter for gynecological examination (general) (routine) without abnormal findings: Secondary | ICD-10-CM | POA: Diagnosis not present

## 2024-09-16 DIAGNOSIS — Z1231 Encounter for screening mammogram for malignant neoplasm of breast: Secondary | ICD-10-CM | POA: Diagnosis not present

## 2024-10-18 ENCOUNTER — Ambulatory Visit: Admitting: Neurology

## 2025-03-01 ENCOUNTER — Ambulatory Visit: Admitting: Neurology
